# Patient Record
Sex: Male | Born: 1975 | Race: White | Hispanic: No | Marital: Single | Smoking: Former smoker
Health system: Southern US, Community
[De-identification: ages and names within clinical notes are randomized; demographics above are authoritative.]

## PROBLEM LIST (undated history)

## (undated) DIAGNOSIS — F17200 Nicotine dependence, unspecified, uncomplicated: Secondary | ICD-10-CM

## (undated) DIAGNOSIS — J45909 Unspecified asthma, uncomplicated: Secondary | ICD-10-CM

## (undated) DIAGNOSIS — F3189 Other bipolar disorder: Secondary | ICD-10-CM

## (undated) HISTORY — DX: Other bipolar disorder: F31.89

## (undated) HISTORY — DX: Nicotine dependence, unspecified, uncomplicated: F17.200

## (undated) HISTORY — DX: Unspecified asthma, uncomplicated: J45.909

---

## 2004-01-08 ENCOUNTER — Ambulatory Visit: Payer: Self-pay | Admitting: Internal Medicine

## 2004-02-24 ENCOUNTER — Ambulatory Visit: Payer: Self-pay | Admitting: Internal Medicine

## 2004-04-05 ENCOUNTER — Emergency Department (HOSPITAL_COMMUNITY): Admission: EM | Admit: 2004-04-05 | Discharge: 2004-04-05 | Payer: Self-pay | Admitting: Emergency Medicine

## 2004-05-27 ENCOUNTER — Ambulatory Visit: Payer: Self-pay | Admitting: Internal Medicine

## 2004-06-02 ENCOUNTER — Ambulatory Visit: Payer: Self-pay | Admitting: Internal Medicine

## 2004-06-24 ENCOUNTER — Ambulatory Visit: Payer: Self-pay | Admitting: Internal Medicine

## 2004-11-05 ENCOUNTER — Ambulatory Visit: Payer: Self-pay | Admitting: *Deleted

## 2004-11-19 ENCOUNTER — Ambulatory Visit: Payer: Self-pay | Admitting: Internal Medicine

## 2004-12-10 ENCOUNTER — Ambulatory Visit: Payer: Self-pay | Admitting: Internal Medicine

## 2004-12-17 ENCOUNTER — Ambulatory Visit: Payer: Self-pay | Admitting: Licensed Clinical Social Worker

## 2005-01-15 ENCOUNTER — Ambulatory Visit: Payer: Self-pay | Admitting: Family Medicine

## 2005-03-10 ENCOUNTER — Ambulatory Visit: Payer: Self-pay | Admitting: Internal Medicine

## 2005-04-06 ENCOUNTER — Ambulatory Visit: Payer: Self-pay | Admitting: Internal Medicine

## 2006-01-24 ENCOUNTER — Ambulatory Visit: Payer: Self-pay | Admitting: Internal Medicine

## 2006-09-01 DIAGNOSIS — F3189 Other bipolar disorder: Secondary | ICD-10-CM | POA: Insufficient documentation

## 2006-10-26 ENCOUNTER — Ambulatory Visit: Payer: Self-pay | Admitting: Internal Medicine

## 2006-10-26 ENCOUNTER — Encounter: Payer: Self-pay | Admitting: Internal Medicine

## 2006-10-26 DIAGNOSIS — J45909 Unspecified asthma, uncomplicated: Secondary | ICD-10-CM | POA: Insufficient documentation

## 2007-07-21 ENCOUNTER — Ambulatory Visit: Payer: Self-pay | Admitting: Psychiatry

## 2007-07-21 ENCOUNTER — Inpatient Hospital Stay (HOSPITAL_COMMUNITY): Admission: AD | Admit: 2007-07-21 | Discharge: 2007-07-23 | Payer: Self-pay | Admitting: Psychiatry

## 2007-10-25 ENCOUNTER — Emergency Department (HOSPITAL_COMMUNITY): Admission: EM | Admit: 2007-10-25 | Discharge: 2007-10-25 | Payer: Self-pay | Admitting: *Deleted

## 2007-10-28 ENCOUNTER — Emergency Department (HOSPITAL_COMMUNITY): Admission: EM | Admit: 2007-10-28 | Discharge: 2007-10-29 | Payer: Self-pay | Admitting: Emergency Medicine

## 2008-07-24 ENCOUNTER — Ambulatory Visit: Payer: Self-pay | Admitting: Internal Medicine

## 2008-07-25 ENCOUNTER — Telehealth: Payer: Self-pay | Admitting: Internal Medicine

## 2008-07-25 DIAGNOSIS — F17200 Nicotine dependence, unspecified, uncomplicated: Secondary | ICD-10-CM | POA: Insufficient documentation

## 2008-08-15 ENCOUNTER — Telehealth: Payer: Self-pay | Admitting: Internal Medicine

## 2008-08-21 ENCOUNTER — Encounter: Payer: Self-pay | Admitting: Internal Medicine

## 2008-08-22 ENCOUNTER — Telehealth: Payer: Self-pay | Admitting: Internal Medicine

## 2008-08-29 ENCOUNTER — Encounter: Payer: Self-pay | Admitting: Internal Medicine

## 2008-09-01 ENCOUNTER — Telehealth (INDEPENDENT_AMBULATORY_CARE_PROVIDER_SITE_OTHER): Payer: Self-pay | Admitting: *Deleted

## 2008-09-12 ENCOUNTER — Telehealth: Payer: Self-pay | Admitting: Internal Medicine

## 2008-09-19 ENCOUNTER — Telehealth: Payer: Self-pay | Admitting: Internal Medicine

## 2008-10-07 ENCOUNTER — Telehealth: Payer: Self-pay | Admitting: Internal Medicine

## 2008-11-04 ENCOUNTER — Encounter: Payer: Self-pay | Admitting: Internal Medicine

## 2008-11-24 ENCOUNTER — Telehealth: Payer: Self-pay | Admitting: Internal Medicine

## 2008-11-25 ENCOUNTER — Telehealth: Payer: Self-pay | Admitting: Internal Medicine

## 2008-11-27 ENCOUNTER — Telehealth: Payer: Self-pay | Admitting: Internal Medicine

## 2008-12-17 ENCOUNTER — Telehealth: Payer: Self-pay | Admitting: Internal Medicine

## 2009-02-02 ENCOUNTER — Telehealth: Payer: Self-pay | Admitting: Internal Medicine

## 2009-02-04 ENCOUNTER — Telehealth: Payer: Self-pay | Admitting: Internal Medicine

## 2009-02-19 ENCOUNTER — Telehealth: Payer: Self-pay | Admitting: Internal Medicine

## 2009-03-02 ENCOUNTER — Encounter: Payer: Self-pay | Admitting: Internal Medicine

## 2009-03-04 ENCOUNTER — Telehealth: Payer: Self-pay | Admitting: Internal Medicine

## 2009-03-06 ENCOUNTER — Telehealth: Payer: Self-pay | Admitting: Internal Medicine

## 2009-05-07 ENCOUNTER — Telehealth: Payer: Self-pay | Admitting: Internal Medicine

## 2009-05-11 ENCOUNTER — Telehealth: Payer: Self-pay | Admitting: Internal Medicine

## 2009-05-20 ENCOUNTER — Telehealth: Payer: Self-pay | Admitting: Internal Medicine

## 2009-05-26 ENCOUNTER — Encounter: Payer: Self-pay | Admitting: Internal Medicine

## 2009-06-08 ENCOUNTER — Ambulatory Visit: Payer: Self-pay | Admitting: Internal Medicine

## 2009-06-16 ENCOUNTER — Telehealth: Payer: Self-pay | Admitting: Internal Medicine

## 2009-07-07 ENCOUNTER — Encounter: Payer: Self-pay | Admitting: Internal Medicine

## 2009-07-07 ENCOUNTER — Telehealth: Payer: Self-pay | Admitting: Internal Medicine

## 2009-09-15 ENCOUNTER — Telehealth: Payer: Self-pay | Admitting: Internal Medicine

## 2009-09-15 ENCOUNTER — Encounter: Payer: Self-pay | Admitting: Internal Medicine

## 2009-09-22 ENCOUNTER — Encounter: Payer: Self-pay | Admitting: Internal Medicine

## 2009-10-01 ENCOUNTER — Encounter: Payer: Self-pay | Admitting: Internal Medicine

## 2009-10-06 ENCOUNTER — Telehealth: Payer: Self-pay | Admitting: Internal Medicine

## 2010-02-27 ENCOUNTER — Encounter: Payer: Self-pay | Admitting: Internal Medicine

## 2010-03-09 NOTE — Medication Information (Signed)
Summary: Assist forms/[employer]  Assist forms/[employer]   Imported By: Sherian Rein 09/22/2009 08:35:26  _____________________________________________________________________  External Attachment:    Type:   Image     Comment:   External Document

## 2010-03-09 NOTE — Letter (Signed)
Summary: Generic Letter  [facility]  [address] [place], [place] [address]   Phone: [phone]  Fax: [phone]      03/02/2009  Mark Page [address] [place], [place]  [address]   Dear Mr. Brooke Dare,  Our office has not received a return call from you regarding your patient assistance paperwork. Enclosed are patient assistance forms and a 4506-T tax form that you must sign and mail/fax in order to receive your medications. Please call our office if you have any questions.      Sincerely,   Lucious Groves, CMA (AAMA)

## 2010-03-09 NOTE — Assessment & Plan Note (Signed)
Summary: PER MOM ANGER CRYING SELF HURTING THREATS--STC   Vital Signs:  Patient profile:   35 year old male Height:      69 inches Weight:      162 pounds BMI:     24.01 O2 Sat:      97 % on Room air Temp:     98.1 degrees F oral Pulse rate:   78 / minute BP sitting:   132 / 86  (left arm) Cuff size:   regular  Vitals Entered By: Bill Salinas CMA (Jun 08, 2009 1:06 PM)  O2 Flow:  Room air CC: pt had episode of anger and crying with self hurting threats/ ab   Primary Care Provider:  [facility]  CC:  pt had episode of anger and crying with self hurting threats/ ab.  History of Present Illness: Patient with severe bipolar disease who presents, along with his mother, for assistance with life stressors and meds. 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 He was out of depakote for 3 weeks. He was having night terrors which he blammed on seroquel, so he stopped this med. He has had several manic episodes including uncontrolled anger episodes. He reports having a 5 day in-patient stay at [facility] and was diagnosed with depression, not bipolar disease, and was started on an antidepressant. He was seen at Mental Health but felt the in-take physician did not believe he was bipolar. He was directed to a half-way house, [facility], but feels "he is not that kind of person."   He is desperate and very unhappy. He on direct questioning denies any suicidal ideation claming he would never hurt his mother that way.  Current Medications (verified): 1)  Lamictal 100 Mg  Tabs (Lamotrigine) .... Take 1 Tablet By Mouth Once A Day 2)  Depakote 500 Mg Tbec (Divalproex Sodium) .Marland Kitchen.. 1 Tab Three Times A Day 3)  Seroquel 100 Mg  Tabs (Quetiapine Fumarate) .... 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  The patient denies fever, vision loss, decreased hearing, chest pain, syncope, dyspnea on exertion, peripheral edema, abdominal pain, hematochezia, muscle weakness, suspicious skin lesions, difficulty walking, and enlarged lymph nodes.    Physical Exam  General:  Mildly dissheveled white male who has rapid speech and what seems to be poor focus, having to be brought back on track during our interview.  Head:  normocephalic and atraumatic.   Neck:  supple.   Chest Wall:  no deformities.   Lungs:  normal respiratory effort, normal breath sounds, no fremitus, and no crackles.   Heart:  normal rate, regular rhythm, no murmur, no rub, and no JVD.   Abdomen:  soft and normal bowel sounds.   Msk:  no joint tenderness, no joint swelling, and no joint instability.   Pulses:  2+ radial Neurologic:  alert & oriented X3, cranial nerves II-XII intact, strength normal in all extremities, and gait normal.   Skin:  turgor normal.   Psych:  Oriented X3.  Poor eye contact. anxious demeanor. Rapid speech. No frankly paranoid thougts, no suicidal ideation.    Impression & Recommendations:  Problem # 1:  Hx of DISORDER, BIPOLAR NEC (ICD-296.89) Patient now getting back on meds except he doesn't have serquel. Looked at prices for other 2nd generation antipsychotics - no price advantage. He is now homeless and at loose ends not knowing which way to turn. He is not suicidal by his report     Plan - lexapro 10mg  once daily - 28 days samples provided.           Continue present meds; reapply for patient assistance for seroquel with proof of income          Referred to [facility] with hopes of getting psychiatric and counselling assistance.          Referred to [facility], mental health pharmacy. He should also work with the [place] system program of fee reductions for the poor.           Suggested DSS for emergency services.           Told him to keep an open mind about [facility].   His condition is worrisome but does not seem life-threatening. He is to call back if he has any ideas of self harm or harm to others.   Complete Medication List: 1)  Lamictal 100 Mg Tabs (Lamotrigine) .... Take 1 tablet by mouth once a day 2)  Depakote 500 Mg Tbec (Divalproex sodium) .Marland Kitchen.. 1 tab three times a day 3)  Seroquel 100 Mg Tabs (Quetiapine fumarate) .... Qhs

## 2010-03-09 NOTE — Medication Information (Signed)
Summary: Assist forms/[employer]  Assist forms/[employer]   Imported By: Sherian Rein 09/22/2009 08:36:58  _____________________________________________________________________  External Attachment:    Type:   Image     Comment:   External Document

## 2010-03-09 NOTE — Progress Notes (Signed)
  Phone Note Other Incoming   Summary of Call: received package for pt assistance with 3 month supply of Depakote 500mg . lmoam for pt to call back to inform him to pick up meds Initial call taken by: Ami Bullins CMA,  October 06, 2009 3:11 PM  Follow-up for Phone Call        mailed pt at letter stating that his assistance medication for depakote is here at the office and he can come pick it up at anytime Follow-up by: Ami Bullins CMA,  October 07, 2009 11:03 AM     Appended Document:  Pt informed

## 2010-03-09 NOTE — Progress Notes (Signed)
Summary: LEXAPRO  Phone Note Call from Patient Call back at [phone]   Summary of Call: Pt was given lexapro at last office visit. Ok to update EMR? Pt will drop off patient assistance paperwork today.  Initial call taken by: Lamar Sprinkles, CMA,  Jul 07, 2009 1:46 PM  Follow-up for Phone Call        Please - update med list.  Follow-up by: Jacques Navy MD,  Jul 07, 2009 6:04 PM  Additional Follow-up for Phone Call Additional follow up Details #1::        Samples x 6 wks ready, left vm for pt Additional Follow-up by: Lamar Sprinkles, CMA,  July 08, 2009 8:29 AM    New/Updated Medications: LEXAPRO 10 MG TABS (ESCITALOPRAM OXALATE) 1 once daily

## 2010-03-09 NOTE — Progress Notes (Signed)
Summary: PT ASSIST REFILL  Phone Note Call from Patient Call back at [phone]   Summary of Call: Patient is requesting refill of Depakote thru patient assistance. Need to call pt assist for refill, forms should be scanned into pt's chart.  Initial call taken by: Lamar Sprinkles, CMA,  May 11, 2009 3:09 PM  Follow-up for Phone Call        pt states he has spoken with pt assistance and they have informed him that this medication is on back order, pt is afraid what will happen if he is not taken this medicaiton, what do you advise? Follow-up by: Ami Bullins CMA,  May 13, 2009 1:05 PM  Additional Follow-up for Phone Call Additional follow up Details #1::        I have no other helpful alternative, such as different medication  at this time Additional Follow-up by: Corwin Levins MD,  May 13, 2009 1:11 PM     Appended Document: PT ASSIST REFILL On friday I called for refill and have not been notified of any back order on medication.

## 2010-03-09 NOTE — Medication Information (Signed)
Summary: [employer] Patient Assistance  [employer] Patient Assistance   Imported By: Lester Tenkiller 09/24/2009 09:30:59  _____________________________________________________________________  External Attachment:    Type:   Image     Comment:   External Document

## 2010-03-09 NOTE — Progress Notes (Signed)
  Phone Note Call from Patient Call back at [phone]   Summary of Call: Patient needs pt assistance forms completed and [place] to call in refill of depakote thru patient assistance.  Initial call taken by: Lamar Sprinkles, CMA,  September 15, 2009 4:40 PM  Follow-up for Phone Call        Depakote assistance has expired, new form is on the way to office to be completed.  Follow-up by: Lamar Sprinkles, CMA,  September 16, 2009 5:11 PM  Additional Follow-up for Phone Call Additional follow up Details #1::        Patient called to check stauts of pt asst forms fro Depakote. Lamictal, and Seroquel. Pt is out of Lexapro. Please advise.Alvy Beal Archie CMA  September 21, 2009 4:24 PM     Additional Follow-up for Phone Call Additional follow up Details #2::    Left vm for pt that forms are all completed.  Follow-up by: Lamar Sprinkles, CMA,  September 24, 2009 9:44 AM  Prescriptions: DEPAKOTE 500 MG TBEC (DIVALPROEX SODIUM) 1 tab three times a day  #270 x 3   Entered by:   Lamar Sprinkles, CMA   Authorized by:   Jacques Navy MD   Signed by:   Lamar Sprinkles, CMA on 09/22/2009   Method used:   Print then Give to Patient   RxID:   [id number] LEXAPRO 10 MG TABS (ESCITALOPRAM OXALATE) 1 once daily  #90 x 3   Entered by:   Lamar Sprinkles, CMA   Authorized by:   Jacques Navy MD   Signed by:   Lamar Sprinkles, CMA on 09/15/2009   Method used:   Print then Give to Patient   RxID:   [id number] SEROQUEL 100 MG  TABS (QUETIAPINE FUMARATE) qHS  #90 x 3   Entered by:   Lamar Sprinkles, CMA   Authorized by:   Jacques Navy MD   Signed by:   Lamar Sprinkles, CMA on 09/15/2009   Method used:   Print then Give to Patient   RxID:   [id number]

## 2010-03-09 NOTE — Progress Notes (Signed)
Summary: Depakote refill  Phone Note Refill Request Call back at [phone] Message from:  Patient on May 07, 2009 3:48 PM  Refills Requested: Medication #1:  DEPAKOTE ER 500 MG  TB24 three times a day Patient called stating that he is beginning to run low.  Initial call taken by: Lucious Groves,  May 07, 2009 3:48 PM  Follow-up for Phone Call        ok for as needed refill Follow-up by: Jacques Navy MD,  May 07, 2009 3:58 PM    Prescriptions: DEPAKOTE ER 500 MG  TB24 (DIVALPROEX SODIUM) three times a day  #36 x 0   Entered by:   Bill Salinas CMA   Authorized by:   Jacques Navy MD   Signed by:   Bill Salinas CMA on 05/08/2009   Method used:   Electronically to        [facility]  [address] [phone]* (retail)       [address]       [place], [place]  [address]       Ph: [phone] or [phone]       Fax: [phone]   RxID:   [id number]

## 2010-03-09 NOTE — Progress Notes (Signed)
Summary: Samples  Phone Note Call from Patient   Summary of Call: Patient is requesting samples of seroquel. In the past, see a previous phone note, Dr has approved samples of XR form.  Initial call taken by: Lamar Sprinkles, CMA,  March 06, 2009 11:39 AM  Follow-up for Phone Call        is this ok to give? Patient assistance paperwork was mailed to him and we have not rec'd a reply from company. Follow-up by: Lucious Groves,  March 10, 2009 10:31 AM  Additional Follow-up for Phone Call Additional follow up Details #1::        he may have samples if we have them Additional Follow-up by: Jacques Navy MD,  March 10, 2009 1:30 PM    Additional Follow-up for Phone Call Additional follow up Details #2::    clinic does not have ay samples of Seroquel at this time but I informed pt that we willkeep an eye out for the for him and we have been in contact with Pt Asst as well.  Follow-up by: Margaret Pyle, CMA,  March 10, 2009 2:03 PM

## 2010-03-09 NOTE — Medication Information (Signed)
Summary: Depakote approved/[employer] Patient Assist  Depakote approved/[employer] Patient Assist   Imported By: Sherian Rein 10/29/2009 09:54:08  _____________________________________________________________________  External Attachment:    Type:   Image     Comment:   External Document

## 2010-03-09 NOTE — Progress Notes (Signed)
Summary: Pt asst meds arrived  Phone Note Outgoing Call   Summary of Call: Left message on voicemail that patient assistance meds have arrived. Depakote ER 250mg . Initial call taken by: Lucious Groves,  Jun 16, 2009 3:57 PM

## 2010-03-09 NOTE — Progress Notes (Signed)
Summary: patient assitance  Phone Note Call from Patient Call back at Home Phone [phone] Call back at [phone]   Summary of Call: Patient left message on triage and I returned his call. I notified patient that after leaving messages with no reply, his paperwork was mailed to him. Patient is aware that the enclosed tax form with the patient assistance packet must be signed and sent in with the packet. I advised that the patient speak with them about why it is much easier to get his other meds, and inquire if the will accept the packet via fax. Initial call taken by: Lucious Groves,  March 04, 2009 12:52 PM  Follow-up for Phone Call        Is this OK Follow-up by: Jacques Navy MD,  March 04, 2009 1:42 PM  Additional Follow-up for Phone Call Additional follow up Details #1::        Yes. The patient must provide further income verification to [employer] in order to qualify for patient assistance.  There were two previous attempts to help the patient with this process. The form is an income request form that the patient must sign releasing his info from the IRS to [facility] & Me program. There is nothing further we can do at this point.  Additional Follow-up by: Lamar Sprinkles, CMA,  March 05, 2009 8:56 AM

## 2010-03-09 NOTE — Progress Notes (Signed)
Summary: [employer] Pt Assistance  Phone Note Call from Patient Call back at Home Phone [phone]   Summary of Call: Patient is requesting a call regarding seroquel pt assistance. Per pt, when he called to get a refill he was told that his assistance was terminated. Need to call drug comp and inquire as how to help the patient.  Initial call taken by: Lamar Sprinkles, CMA,  February 19, 2009 6:25 PM  Follow-up for Phone Call        Spoke with Henre[employer] and patient meds went out at the end of December, he is still eligble in the program and will refill in feb 2011. I also called into the AZ&Me program, and the app was incomplete. There were some areas not filled out, and the letter from the MD that patient does not have any income is insufficient. They will fax me a form to be filled out and prove the patient did not file taxes. If taxes were filed, a copy must be included with the app. Follow-up by: Lucious Groves,  February 20, 2009 9:07 AM  Additional Follow-up for Phone Call Additional follow up Details #1::        Called patient to discuss, Left message on voicemail to call back to office.Lucious Groves,  February 20, 2009 9:24 AM  Please see phone note from last october. Tax form was completed and mailed. SORRY!!! I must not have scanned a copy in EMR. They must not have gotten form? Just FYI for you........................Marland KitchenLamar Sprinkles, CMA  February 24, 2009 10:37 AM     Additional Follow-up for Phone Call Additional follow up Details #2::    Either way the patient has to sign the tax exemption form.  Left message on voicemail to call back to office. Lucious Groves,  February 25, 2009 4:07 PM  Left message on voicemail to call back to office.Lucious Groves,  February 26, 2009 1:00 PM  Made one last attempt to contact the patient-Left message on voicemail notifying patient that paperwork requires his signature. This is last attempt, if no response from patient I will mail a letter to arrange for  signature.Lucious Groves,  February 27, 2009 11:12 AM  Additional Follow-up for Phone Call Additional follow up Details #3:: Details for Additional Follow-up Action Taken: Patient has not returned my calll to come in and sign forms--Specifically 4506-T tax form. Mailed paperwork to patient home address with letter. Additional Follow-up by: Lucious Groves,  March 02, 2009 10:41 AM

## 2010-03-09 NOTE — Medication Information (Signed)
Summary: [facility] [employer] Patient Assist Program  [facility] Patient Assist Program   Imported By: Sherian Rein 07/09/2009 10:55:45  _____________________________________________________________________  External Attachment:    Type:   Image     Comment:   External Document

## 2010-03-09 NOTE — Progress Notes (Signed)
Summary: Depakote  Phone Note Call from Patient Call back at [phone]   Summary of Call: Patient called b/c he is completely out of Depakote. Patient would like to know if we can do patient assistance for him to take the 250mg , which he has spoken with patient assistance and verified that it is available. (He takes the 500mg  three times a day). Please advise. Initial call taken by: Lucious Groves,  May 20, 2009 3:01 PM  Follow-up for Phone Call        Maralyn Sago has helped with patient assistance in the past. He may take the depakote 250 2 tabs three times a day = 180/month as needed refills Follow-up by: Jacques Navy MD,  May 20, 2009 3:05 PM  Additional Follow-up for Phone Call Additional follow up Details #1::        New form completed & faxed Additional Follow-up by: Lamar Sprinkles, CMA,  May 26, 2009 4:44 PM    New/Updated Medications: DEPAKOTE ER 250 MG XR24H-TAB (DIVALPROEX SODIUM) 2 three times a day

## 2010-03-09 NOTE — Medication Information (Signed)
Summary: Seroquel/Free [employer]  Seroquel/Free [employer]   Imported By: Lester Olmito and Olmito 03/04/2009 14:41:55  _____________________________________________________________________  External Attachment:    Type:   Image     Comment:   External Document

## 2010-03-09 NOTE — Medication Information (Signed)
Summary: Depakote/[employer] Pt Assist Program  Depakote/[employer] Pt Assist Program   Imported By: Sherian Rein 06/01/2009 08:32:17  _____________________________________________________________________  External Attachment:    Type:   Image     Comment:   External Document

## 2010-03-12 NOTE — Medication Information (Signed)
Summary: [employer] Patient Assist Program  [employer] Patient Assist Program   Imported By: Sherian Rein 07/09/2009 10:54:10  _____________________________________________________________________  External Attachment:    Type:   Image     Comment:   External Document

## 2010-04-20 ENCOUNTER — Inpatient Hospital Stay (INDEPENDENT_AMBULATORY_CARE_PROVIDER_SITE_OTHER)
Admission: RE | Admit: 2010-04-20 | Discharge: 2010-04-20 | Disposition: A | Discharge status: Home / Self Care | Payer: Self-pay | Source: Ambulatory Visit | Attending: Family Medicine | Admitting: Family Medicine

## 2010-04-20 ENCOUNTER — Ambulatory Visit (INDEPENDENT_AMBULATORY_CARE_PROVIDER_SITE_OTHER): Payer: Self-pay

## 2010-04-20 DIAGNOSIS — S20219A Contusion of unspecified front wall of thorax, initial encounter: Secondary | ICD-10-CM

## 2010-06-22 NOTE — H&P (Signed)
NAME:  Mark Page, TRAMMEL NO.:  [id number]   MEDICAL RECORD NO.:  [id number]          PATIENT TYPE:  IPS   LOCATION:  0301                          FACILITY:  [facility]   PHYSICIAN:  Geoffery Lyons, M.D.      DATE OF BIRTH:  12-02-1975   DATE OF ADMISSION:  07/21/2007  DATE OF DISCHARGE:                       PSYCHIATRIC ADMISSION ASSESSMENT   IDENTIFYING INFORMATION:  This is a 35 year old single white male. Mr.  Mark Page presented as a walk-in last evening. He reported that he has had a  past diagnosis for bipolar disorder. He is having problems with his  primary support system. He indicated that he was evicted from his former  place on living on July 17, 2007. Since that time, he has been  experiencing racing thoughts, insomnia, and panic attacks. He reported  he was tired of living in constant torment of always hurting people who  love him. He reports that he has a history for abusing alcohol, Xanax  and marijuana. He states he has not used Xanax in years. He probably  will be positive on his UDS for marijuana, and when he does drink, he  binges, but he has not been under the influence of alcohol in a while.   He states that approximately three years ago he sought psychiatric  assistance from [facility]. He states he was given a diagnosis of  bipolar disorder then. He was treated with Lamictal, Seroquel and  Depakote. He stated that he had side effects, that it made him shake,  and it did not help his racing thoughts. Apparently, when he becomes  worried, he experiences racing thoughts. He also reports that his panic  comes from stress. He has not been on medications in quite some time. He  has no prior hospitalizations.   SOCIAL HISTORY:  He reports having gotten a GED and come college. He has  been unemployed for the past year and a half. 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 He was staying with friends  in a house the past 9 months.  They argued, and they told him to leave. He had prepared his June's  rent. He asked the police to come out to get him reimbursement. The  people in the house said that they would give the money to his mother.  However, none of the above has happened.   FAMILY HISTORY:  He denies any family history for mental illness.   ALCOHOL AND DRUG HISTORY:  Has already been alluded to.   PRIMARY CARE PHYSICIAN:  Dr. Illene Regulus at [facility]. He has not  seen Dr. Nolen Mu for medication management in several years.   MEDICAL PROBLEMS:  He denies any.   MEDICATIONS:  He is not prescribed any.   DRUG ALLERGIES:  He is not known to have any drug allergies.   POSITIVE PHYSICAL FINDINGS:  This is a well-developed, well-nourished  male who appears his stated age of 43.  VITAL SIGNS:  On admission show he is 132 cm. He weighs  158. His  temperature is 97.2. Blood pressure was 125/82 to 135/83. Pulse was 73.  Respirations are 16.  Apparently, he has been known to have asthma in the past, and indeed  today, he sounds kind of junky. He reports he has been smoking over 2  packs of cigarettes a day for the past 4 days, and when examined, he did  have some upper respiratory wheezes.   He has no other positives on his review of systems, and unfortunately,  his lab is pending.   MENTAL STATUS EXAMINATION:  Today, he is alert and oriented. He was  casually dressed in hospital attire. His clothing is being washed. He  appeared to be adequately groomed and nourished. His motor was within  normal limits, and he had fair eye contact. His speech is not pressured.  His mood is depressed. His affect is decreased. Thought processes were  clear, rationale, and goal oriented although somewhat fatalistic. He  does not see much help for him at the moment. Judgment and insight are  fair. Concentration and memory are fair. Intelligence is at least  average. He specifically denies  being suicidal or homicidal. He denies  auditory or visual hallucinations.   DIAGNOSES:  AXIS I:  Adjustment disorder with depressed mood. History  for impulse control disorder.  AXIS II:  Deferred.  AXIS III:  Asthma.  AXIS IV:  Severe problems with primary support group, problems related  to the social environment, occupational, housing, and economic issues.  AXIS V:  30.   PLAN:  Admit for safety and stabilization. We will start him on some  medication, being sensitive to his financial situation. Will start him  on some Celexa a.m. and h.s. and also order some Neurontin p.r.n. for  his panic attacks. He was given the 1-800 number to the small business  association to help him sort out his current employment issues and his  responsibilities vis-a-vis his company. Estimated length of stay is 3 to  4 days. Will have the case manager work with him on Monday regarding  discharge placement as he currently is residing in his [place] and denies  any social support from his family.      Mickie Leonarda Salon, P.A.-C.      Geoffery Lyons, M.D.  Electronically Signed    MD/MEDQ  D:  07/21/2007  T:  07/21/2007  Job:  [id number]

## 2010-06-25 NOTE — Assessment & Plan Note (Signed)
[facility]                                 ON-CALL NOTE   NAME:Russaw, [place] Elbert Ewings                         MRN:          [id number]  DATE:01/27/2006                            DOB:          1975/12/31    Phone number [phone].   SUBJECTIVE:  Mr. Baig states he was given a voucher for Lamictal.  When  he went to the pharmacy they said that he needed a prescription as well.  He is not sure if Dr. Debby Bud' nurse sent the prescription to any  pharmacy.   ASSESSMENT AND PLAN:  The patient was instructed to call the office in  the morning.  He has not been on Lamictal for some time, and will not go  through withdrawal.     Kerby Nora, MD  Electronically Signed    AB/MedQ  DD: 01/27/2006  DT: 01/29/2006  Job #: [id number]
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
## 2010-11-08 LAB — DIFFERENTIAL
Basophils Absolute: 0
Basophils Relative: 1
Eosinophils Absolute: 0.2
Eosinophils Relative: 2
Lymphocytes Relative: 30
Lymphs Abs: 2.4
Monocytes Absolute: 0.4
Monocytes Relative: 6
Neutro Abs: 5
Neutrophils Relative %: 62

## 2010-11-08 LAB — CBC
HCT: 39.3
Hemoglobin: 13.3
MCHC: 33.8
MCV: 92.8
Platelets: 231
RBC: 4.23
RDW: 12.9
WBC: 8.1

## 2010-11-08 LAB — POCT I-STAT, CHEM 8
Glucose, Bld: 106 — ABNORMAL HIGH
HCT: 40
Hemoglobin: 13.6
Potassium: 3.7
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
## 2012-01-20 ENCOUNTER — Emergency Department (HOSPITAL_COMMUNITY)
Admission: EM | Admit: 2012-01-20 | Discharge: 2012-01-20 | Disposition: A | Discharge status: Home / Self Care | Payer: Self-pay | Attending: Emergency Medicine | Admitting: Emergency Medicine

## 2012-01-20 ENCOUNTER — Encounter (HOSPITAL_COMMUNITY): Payer: Self-pay | Admitting: Adult Health

## 2012-01-20 DIAGNOSIS — R21 Rash and other nonspecific skin eruption: Secondary | ICD-10-CM | POA: Insufficient documentation

## 2012-01-20 DIAGNOSIS — G501 Atypical facial pain: Secondary | ICD-10-CM | POA: Insufficient documentation

## 2012-01-20 DIAGNOSIS — Z87891 Personal history of nicotine dependence: Secondary | ICD-10-CM | POA: Insufficient documentation

## 2012-01-20 MED ORDER — IBUPROFEN 800 MG PO TABS
800.0000 mg | ORAL_TABLET | Freq: Once | ORAL | Status: AC
  Administered 2012-01-20: 800 mg via ORAL
  Filled 2012-01-20: qty 1

## 2012-01-20 MED ORDER — CLINDAMYCIN HCL 300 MG PO CAPS
300.0000 mg | ORAL_CAPSULE | Freq: Once | ORAL | Status: AC
  Administered 2012-01-20: 300 mg via ORAL
  Filled 2012-01-20 (×2): qty 1

## 2012-01-20 MED ORDER — CLINDAMYCIN HCL 150 MG PO CAPS: 150.0000 mg | ORAL_CAPSULE | Freq: Four times a day (QID) | ORAL | Status: DC

## 2012-01-20 MED ORDER — HYDROCODONE-ACETAMINOPHEN 5-325 MG PO TABS: 2.0000 | ORAL_TABLET | ORAL | Status: DC | PRN

## 2012-01-20 NOTE — ED Provider Notes (Signed)
Medical screening examination/treatment/procedure(s) were performed by non-physician practitioner and as supervising physician I was immediately available for consultation/collaboration.  Olivia Mackie, MD 01/20/12 [phone]

## 2012-01-20 NOTE — ED Notes (Signed)
C/o left sided "ingrown hair that I tried to dig out this evening and it started swelling and it has worsened over the last 6 hours" c/o pain rated 9/10 and is constant, left sided facial redness noted and induration. Airway intact.  Reports small amount of drainage.

## 2012-01-20 NOTE — ED Notes (Signed)
Patient called requesting more pain medication. Patient can follow up with PCP or come back if necessary per Abigail.

## 2012-01-20 NOTE — ED Notes (Signed)
Pharmacy called for clindamycin

## 2012-01-20 NOTE — ED Notes (Signed)
PA at bedside.

## 2012-01-20 NOTE — ED Provider Notes (Signed)
History     CSN: [id number]  Arrival date & time 01/20/12  0210   First MD Initiated Contact with Patient 01/20/12 0301      Chief Complaint  Patient presents with  . Facial Pain    (Consider location/radiation/quality/duration/timing/severity/associated sxs/prior treatment) HPI  36 year old male presents with facial pain. Patient reports he had an ingrown hair on his L upper lip in which he attempted to pop it last night.  He was unable to pop it so pt tried "digging it out" with a knife.  He went to sleep and within the past 6 hrs he notice increased swelling to his upper lip with throbbing pain to affected area.  Onset gradual, persistent, moderate in severity, non radiating, worsening with applying pressure.  Denies fever, headache, throat or tongue swelling, trouble swallowing, shortness of breath, or rash.  No prior history of abscess.  No treatment tried.  No new medications  History reviewed. No pertinent past medical history.  History reviewed. No pertinent past surgical history.  History reviewed. No pertinent family history.  History  Substance Use Topics  . Smoking status: Former Games developer  . Smokeless tobacco: Not on file  . Alcohol Use: No      Review of Systems  Constitutional: Negative for fever.  Skin: Positive for wound. Negative for rash.  Neurological: Negative for numbness.    Allergies  Review of patient's allergies indicates no known allergies.  Home Medications  No current outpatient prescriptions on file.  BP 129/80  Pulse 80  Temp 98.5 F (36.9 C) (Oral)  Resp 14  SpO2 97%  Physical Exam  Nursing note and vitals reviewed. Constitutional: He is oriented to person, place, and time. He appears well-developed and well-nourished. No distress.  HENT:  Head: Normocephalic and atraumatic.  Mouth/Throat: Oropharynx is clear and moist. No oropharyngeal exudate.  Eyes: Conjunctivae normal and EOM are normal. Pupils are equal, round, and reactive  to light.  Neck: Neck supple.  Neurological: He is alert and oriented to person, place, and time.  Skin:       L upper lip is moderately indurated and edematous.  Small scab noted to other lip with a small amount of drainage.  No obvious fluctuance.  Ttp.  No erythema.       ED Course  Procedures (including critical care time)  Labs Reviewed - No data to display No results found.   No diagnosis found.  1. Lip edema  MDM  Pt developed edema to L upper lip after attempting to extract and ingrown hair.  Incident happened < 12 hrs with moderate swelling.  Doubt abscess.  Will recommend warm compress, will give abx and pain meds.  Return precaution discussed.  Pt voice understanding and agrees with plan.  No airway compromise.    BP 129/80  Pulse 80  Temp 98.5 F (36.9 C) (Oral)  Resp 14  SpO2 97%       Fayrene Helper, PA-C 01/20/12 0319

## 2012-02-19 ENCOUNTER — Encounter (HOSPITAL_COMMUNITY): Payer: Self-pay

## 2012-02-19 ENCOUNTER — Emergency Department (HOSPITAL_COMMUNITY)
Admission: EM | Admit: 2012-02-19 | Discharge: 2012-02-19 | Disposition: A | Discharge status: Home / Self Care | Payer: Self-pay | Attending: Emergency Medicine | Admitting: Emergency Medicine

## 2012-02-19 DIAGNOSIS — L03211 Cellulitis of face: Secondary | ICD-10-CM | POA: Insufficient documentation

## 2012-02-19 DIAGNOSIS — L0201 Cutaneous abscess of face: Secondary | ICD-10-CM | POA: Insufficient documentation

## 2012-02-19 DIAGNOSIS — Z79899 Other long term (current) drug therapy: Secondary | ICD-10-CM | POA: Insufficient documentation

## 2012-02-19 DIAGNOSIS — L0291 Cutaneous abscess, unspecified: Secondary | ICD-10-CM

## 2012-02-19 DIAGNOSIS — Z87891 Personal history of nicotine dependence: Secondary | ICD-10-CM | POA: Insufficient documentation

## 2012-02-19 MED ORDER — SULFAMETHOXAZOLE-TRIMETHOPRIM 800-160 MG PO TABS: 1.0000 | ORAL_TABLET | Freq: Two times a day (BID) | ORAL | Status: DC

## 2012-02-19 NOTE — ED Provider Notes (Signed)
Medical screening examination/treatment/procedure(s) were conducted as a shared visit with non-physician practitioner(s) and myself.  I personally evaluated the patient during the encounter  Please see my separate respective documentation pertaining to this patient encounter   Burgundy Matuszak D Kemiya Batdorf, MD 02/19/12 0712 

## 2012-02-19 NOTE — ED Provider Notes (Addendum)
History     CSN: [id number]  Arrival date & time 02/19/12  0436   First MD Initiated Contact with Patient 02/19/12 0559      Chief Complaint  Patient presents with  . Abscess    (Consider location/radiation/quality/duration/timing/severity/associated sxs/prior treatment) HPI Comments: This is a 37 year old male, who presents emergency department with chief complaint of abscess. Patient states that he was recently shaving, and cut his chin. After several days he noticed an abscess developing. He is tried using warm compresses, and tried to express fluid from the abscess, gaining some relief after doing so. However, he states that his symptoms are worsening. He states that his level of discomfort is moderate. His symptoms do not radiate.  He denies fever, chills, nausea, and vomiting.  He has a history of the same.  The history is provided by the patient. No language interpreter was used.    History reviewed. No pertinent past medical history.  History reviewed. No pertinent past surgical history.  History reviewed. No pertinent family history.  History  Substance Use Topics  . Smoking status: Former Games developer  . Smokeless tobacco: Not on file  . Alcohol Use: No      Review of Systems  All other systems reviewed and are negative.    Allergies  Review of patient's allergies indicates no known allergies.  Home Medications   Current Outpatient Rx  Name  Route  Sig  Dispense  Refill  . CLINDAMYCIN HCL 150 MG PO CAPS   Oral   Take 1 capsule (150 mg total) by mouth every 6 (six) hours.   28 capsule   0   . HYDROCODONE-ACETAMINOPHEN 5-325 MG PO TABS   Oral   Take 2 tablets by mouth every 4 (four) hours as needed for pain.   10 tablet   0     BP 122/82  Pulse 78  Temp 97.4 F (36.3 C) (Oral)  Resp 20  SpO2 100%  Physical Exam  Nursing note and vitals reviewed. Constitutional: He is oriented to person, place, and time. He appears well-developed and  well-nourished.  HENT:  Head: Normocephalic and atraumatic.  Eyes: Conjunctivae normal and EOM are normal.  Neck: Normal range of motion.  Cardiovascular: Normal rate.   Pulmonary/Chest: Effort normal.  Abdominal: He exhibits no distension.  Musculoskeletal: Normal range of motion.  Neurological: He is alert and oriented to person, place, and time.  Skin: Skin is dry.       2-3 cm abscess on the left inferior aspect of the the chin, with associated redness and erythema, mild purulent drainage.  Psychiatric: He has a normal mood and affect. His behavior is normal. Judgment and thought content normal.    ED Course  Procedures (including critical care time)  Labs Reviewed - No data to display No results found.   1. Abscess       MDM  37 year old male with abscess. Will treat the patient conservatively at this time, with warm compresses and Bactrim. The patient is to return in 48 hours, if he does not have improvement of his symptoms. Patient understands and agrees with the plan. He is stable and ready for discharge. This patient was seen by and discussed with Dr. Hyacinth Meeker, who agrees with the plan.  6:59 AM Patient requesting pain medicine.  I offered the patient Naprosyn.  The patient became verbally abusive, and was no longer cooperating or willing to listen to the treatment plan.  Attempted to explain to the patient that he  needed to be civilized.  Patient said, "I don't care what you give me, give me a leaf that you have wet, like the indians, and I will be happy." I reiterated that I would be happy to give him a prescription for Naprosyn, and he continued to be verbally abusive.  I left the room.  Patient discharged.        Roxy Horseman, PA-C 02/19/12 0640  Roxy Horseman, PA-C 02/19/12 [phone]

## 2012-02-19 NOTE — ED Notes (Signed)
Pt refused to sign discharge due to not getting pain medications

## 2012-02-19 NOTE — ED Notes (Signed)
Pt reports an abscess to (L) side of his chin, pt reports he cut his chin while shaving. Recently seen here for the same but to a different area, applied a hot compress to this area and was able to get "junk" out of it.

## 2012-02-19 NOTE — ED Provider Notes (Signed)
Patient presents with gradual onset of left lower chin abscess. This is in the sub-mental region on the left, it is red and indurated and tender but has a draining area with purulent material coming out from it. He has no trismus, no torticollis, no tenderness under his tongue, normal speech in phonation and no difficulty swallowing.  Filed Vitals:   02/19/12 0440  BP: 122/82  Pulse: 78  Temp: 97.4 F (36.3 C)  Resp: 20   The patient,he is afebrile at this time I believe that conservative medical therapy would be better than incision and drainage of an already open draining abscess.  Bactrim  Medical screening examination/treatment/procedure(s) were conducted as a shared visit with non-physician practitioner(s) and myself.  I personally evaluated the patient during the encounter    Vida Roller, MD 02/19/12 [phone]

## 2012-11-11 ENCOUNTER — Encounter (HOSPITAL_COMMUNITY): Payer: Self-pay | Admitting: *Deleted

## 2012-11-11 ENCOUNTER — Emergency Department (HOSPITAL_COMMUNITY)
Admission: EM | Admit: 2012-11-11 | Discharge: 2012-11-11 | Disposition: A | Discharge status: Home / Self Care | Payer: Self-pay | Attending: Emergency Medicine | Admitting: Emergency Medicine

## 2012-11-11 DIAGNOSIS — Z792 Long term (current) use of antibiotics: Secondary | ICD-10-CM | POA: Insufficient documentation

## 2012-11-11 DIAGNOSIS — L089 Local infection of the skin and subcutaneous tissue, unspecified: Secondary | ICD-10-CM | POA: Insufficient documentation

## 2012-11-11 DIAGNOSIS — Z87891 Personal history of nicotine dependence: Secondary | ICD-10-CM | POA: Insufficient documentation

## 2012-11-11 MED ORDER — CEPHALEXIN 500 MG PO CAPS: 500.0000 mg | ORAL_CAPSULE | Freq: Two times a day (BID) | ORAL | Status: DC

## 2012-11-11 MED ORDER — IBUPROFEN 400 MG PO TABS
800.0000 mg | ORAL_TABLET | Freq: Once | ORAL | Status: AC
  Administered 2012-11-11: 800 mg via ORAL
  Filled 2012-11-11: qty 2

## 2012-11-11 NOTE — ED Provider Notes (Signed)
Medical screening examination/treatment/procedure(s) were performed by non-physician practitioner and as supervising physician I was immediately available for consultation/collaboration.   William Marico Buckle, MD 11/11/12 2338 

## 2012-11-11 NOTE — ED Notes (Signed)
Pt states he thinks he may have been bitten by a spider on his back.  Two reddened warm to touch spots noted to pts lower back

## 2012-11-11 NOTE — ED Provider Notes (Signed)
CSN: [id number]     Arrival date & time 11/11/12  1931 History  This chart was scribed for non-physician practitioner, Irish Elders, NP working with Dagmar Hait, MD by Greggory Stallion, ED scribe. This patient was seen in room TR11C/TR11C and the patient's care was started at 8:27 PM.   Chief Complaint  Patient presents with  . Insect Bite   The history is provided by the patient. No language interpreter was used.    HPI Comments: Mark Page is a 37 y.o. male who presents to the Emergency Department complaining of an insect bite that occurred 4-5 days ago. Pt states it started out itchy. He states he has two spots on his back that are red and warm to the touch. Pt states he thinks he was bitten by a spider on his left lower back but never actually saw a spider. He states he recently got an addition to his tattoo right before he noticed the two spots. He states there was some drainage from one of the spots. Pt denies fever and chills. He denies any recent illness.   History reviewed. No pertinent past medical history. History reviewed. No pertinent past surgical history. No family history on file. History  Substance Use Topics  . Smoking status: Former Smoker    Types: Cigarettes    Quit date: 02/12/2012  . Smokeless tobacco: Not on file  . Alcohol Use: Yes     Comment: socially    Review of Systems  Constitutional: Negative for fever and chills.  Skin: Positive for wound.  All other systems reviewed and are negative.    Allergies  Review of patient's allergies indicates no known allergies.  Home Medications   Current Outpatient Rx  Name  Route  Sig  Dispense  Refill  . cephALEXin (KEFLEX) 500 MG capsule   Oral   Take 1 capsule (500 mg total) by mouth 2 (two) times daily.   14 capsule   0    BP 124/77  Pulse 82  Temp(Src) 98 F (36.7 C) (Oral)  Resp 16  SpO2 99%  Physical Exam  Nursing note and vitals reviewed. Constitutional: He is oriented to person,  place, and time. He appears well-developed and well-nourished. No distress.  HENT:  Head: Normocephalic and atraumatic.  Eyes: EOM are normal.  Neck: Neck supple. No tracheal deviation present.  Cardiovascular: Normal rate, regular rhythm and normal heart sounds.   Pulmonary/Chest: Effort normal and breath sounds normal. No respiratory distress. He has no wheezes. He has no rales.  Musculoskeletal: Normal range of motion.  Neurological: He is alert and oriented to person, place, and time.  Skin: Skin is warm and dry.     Top area 2-3 cm erythematous and warm to touch with centralized scabbing. Bottom area 1-2 cm erythematous and warm to touch with centralized scabbing.   Psychiatric: He has a normal mood and affect. His behavior is normal.    ED Course  Procedures (including critical care time)  DIAGNOSTIC STUDIES: Oxygen Saturation is 99% on RA, normal by my interpretation.    COORDINATION OF CARE: 8:31 PM-Discussed treatment plan which includes oral antibiotic with pt at bedside and pt agreed to plan.   Labs Review Labs Reviewed - No data to display Imaging Review No results found.  MDM    1. Skin infection  Soft/tissue skin infection from spider bite vs. Tattoo needle from recent work done.   I personally performed the services described in this documentation, which was  scribed in my presence. The recorded information has been reviewed and is accurate.      Irish Elders, NP 11/11/12 2112

## 2012-11-11 NOTE — ED Notes (Signed)
Pt discharged.Vital signs stable.Pt says he is pain.Pain med given.Reassured pt that pain will get better with the medication.

## 2012-11-12 ENCOUNTER — Encounter (HOSPITAL_COMMUNITY): Payer: Self-pay

## 2012-11-12 ENCOUNTER — Emergency Department (HOSPITAL_COMMUNITY)
Admission: EM | Admit: 2012-11-12 | Discharge: 2012-11-13 | Disposition: A | Discharge status: Home / Self Care | Payer: Self-pay | Attending: Emergency Medicine | Admitting: Emergency Medicine

## 2012-11-12 DIAGNOSIS — Z87891 Personal history of nicotine dependence: Secondary | ICD-10-CM | POA: Insufficient documentation

## 2012-11-12 DIAGNOSIS — M7989 Other specified soft tissue disorders: Secondary | ICD-10-CM | POA: Insufficient documentation

## 2012-11-12 DIAGNOSIS — L723 Sebaceous cyst: Secondary | ICD-10-CM | POA: Insufficient documentation

## 2012-11-12 DIAGNOSIS — L720 Epidermal cyst: Secondary | ICD-10-CM

## 2012-11-12 DIAGNOSIS — Z881 Allergy status to other antibiotic agents status: Secondary | ICD-10-CM | POA: Insufficient documentation

## 2012-11-12 DIAGNOSIS — M79609 Pain in unspecified limb: Secondary | ICD-10-CM | POA: Insufficient documentation

## 2012-11-12 MED ORDER — HYDROCODONE-ACETAMINOPHEN 5-325 MG PO TABS
1.0000 | ORAL_TABLET | Freq: Once | ORAL | Status: AC
  Administered 2012-11-12: 1 via ORAL
  Filled 2012-11-12: qty 1

## 2012-11-12 MED ORDER — LIDOCAINE HCL 2 % IJ SOLN
10.0000 mL | Freq: Once | INTRAMUSCULAR | Status: AC
  Administered 2012-11-12: 200 mg
  Filled 2012-11-12: qty 20

## 2012-11-12 MED ORDER — HYDROCODONE-ACETAMINOPHEN 5-325 MG PO TABS: 1.0000 | ORAL_TABLET | ORAL | Status: DC | PRN

## 2012-11-12 MED ORDER — SULFAMETHOXAZOLE-TMP DS 800-160 MG PO TABS: 1.0000 | ORAL_TABLET | Freq: Two times a day (BID) | ORAL | Status: DC

## 2012-11-12 MED ORDER — SULFAMETHOXAZOLE-TMP DS 800-160 MG PO TABS
1.0000 | ORAL_TABLET | Freq: Once | ORAL | Status: AC
  Administered 2012-11-12: 1 via ORAL
  Filled 2012-11-12: qty 1

## 2012-11-12 NOTE — ED Provider Notes (Signed)
Medical screening examination/treatment/procedure(s) were performed by non-physician practitioner and as supervising physician I was immediately available for consultation/collaboration.    Vida Roller, MD 11/12/12 2352

## 2012-11-12 NOTE — ED Provider Notes (Signed)
CSN: [id number]     Arrival date & time 11/12/12  2008 History   None    This chart was scribed for non-physician practitioner, Cherrie Distance PA-C, working with Vida Roller, MD by Arlan Organ, ED Scribe. This patient was seen in room TR06C/TR06C and the patient's care was started at 10:05 PM.   Chief Complaint  Patient presents with  . Abscess   The history is provided by the patient. No language interpreter was used.   HPI Comments: Mark Page is a 37 y.o. male who presents to the Emergency Department complaining of an abscess on his lower back that appeared about 5 days ago. He states he is also experiencing associated swelling and pain in his right thumb. Pt states at first he tried to pop the abscess with no relief, but the pain has now gradually worsened. Pt states he recently got a new tattoo on his back 10 days ago.  Pt was seen last night here at [facility] for the same complaint.  He states he is currently having difficulty with normal activities due to the pain. Pt has been taking prescribed ABX with no relief. Pt denies any numbness or weakness.  History reviewed. No pertinent past medical history. History reviewed. No pertinent past surgical history. History reviewed. No pertinent family history. History  Substance Use Topics  . Smoking status: Former Smoker    Types: Cigarettes    Quit date: 02/12/2012  . Smokeless tobacco: Not on file  . Alcohol Use: Yes     Comment: socially    Review of Systems  Neurological: Negative for weakness and numbness.  All other systems reviewed and are negative.    Allergies  Keflex  Home Medications   Current Outpatient Rx  Name  Route  Sig  Dispense  Refill  . cephALEXin (KEFLEX) 500 MG capsule   Oral   Take 500 mg by mouth 2 (two) times daily.          Triage Vitals: BP 123/71  Pulse 98  Temp(Src) 98.4 F (36.9 C) (Oral)  Resp 16  Ht 5\' 10"  (1.778 m)  Wt 170 lb (77.111 kg)  BMI 24.39 kg/m2  SpO2  96%  Physical Exam  Nursing note and vitals reviewed. Constitutional: He is oriented to person, place, and time. He appears well-developed and well-nourished.  HENT:  Head: Normocephalic and atraumatic.  Eyes: EOM are normal.  Neck: Normal range of motion.  Cardiovascular: Normal rate.   Pulmonary/Chest: Effort normal.  Musculoskeletal: Normal range of motion.  Neurological: He is alert and oriented to person, place, and time.  Skin: Skin is warm and dry.  Left superior abscess with 3cm induration without fluctuance  Left inferior abscess with 2cm induration without fluctuance  Psychiatric: He has a normal mood and affect. His behavior is normal.    ED Course  Procedures (including critical care time)  DIAGNOSTIC STUDIES: Oxygen Saturation is 96% on RA, Adequate by my interpretation.    COORDINATION OF CARE: 10:05 PM- Will give antibiotics. Will give medication for pain. Discussed treatment plan with pt at bedside and pt agreed to plan.    10:57 PM-  INCISION AND DRAINAGE Performed by: Cherrie Distance PA-C Consent: Verbal consent obtained. Risks and benefits: risks, benefits and alternatives were discussed Type: abscess  Body area: left superior mid back region  Anesthesia: local infiltration  Incision was made with a scalpel.  Local anesthetic: lidocaine 2% without epinephrine  Anesthetic total: 1 ml  Complexity: complex  Blunt dissection to break up loculations  Drainage: purulent  Drainage amount: minimal  Packing material: 1/4 in iodoform gauze  Patient tolerance: Patient tolerated the procedure well with no immediate complications.   INCISION AND DRAINAGE Performed by: Cherrie Distance PA-C Consent: Verbal consent obtained. Risks and benefits: risks, benefits and alternatives were discussed Type: abscess  Body area: left inferior lower back  Anesthesia: local infiltration  Incision was made with a scalpel.  Local anesthetic: lidocaine 2%  without epinephrine  Anesthetic total: 1 ml  Complexity: complex Blunt dissection to break up loculations  Drainage: purulent  Drainage amount: minimal  Packing material: 1/4 in iodoform gauze  Patient tolerance: Patient tolerated the procedure well with no immediate complications.      Labs Review Labs Reviewed - No data to display Imaging Review No results found.  MDM  Epidermoid cyst,  Infected  Patient seen here with likely cellulitis, now with I&D, though infected cyst likely, there is no evidence of abscess.  Will provide pain control and MRSA coverage.  Will return in 2 days for follow up.  I personally performed the services described in this documentation, which was scribed in my presence. The recorded information has been reviewed and is accurate.   Izola Price Marisue Humble, [place] 11/12/12 2335

## 2012-11-12 NOTE — ED Notes (Signed)
Pt reports he noticed 2 abscess's to his back 5 days ago, pt reports he got a tattoo 10 days ago on his back, pt also c/o pain and swelling to his Right thumb starting today. Pt seen here last night for the same and started on ABX but reports his symptoms are getting worse

## 2012-11-12 NOTE — ED Notes (Signed)
Patient said he came to be seen last night for a "tattoo infection" which is what he was diagnosed with.  The patient said they did not look that bad last night.  The patient said he took two doses of cephalexin 500mg , po, bid and he has gotten worse.  He says he has been itching behind his ears, his right thumb is swollen and his back has gotten worse.  He came back to be seen again.

## 2012-11-14 ENCOUNTER — Emergency Department (HOSPITAL_COMMUNITY)
Admission: EM | Admit: 2012-11-14 | Discharge: 2012-11-14 | Disposition: A | Discharge status: Home / Self Care | Payer: Self-pay | Attending: Emergency Medicine | Admitting: Emergency Medicine

## 2012-11-14 ENCOUNTER — Encounter (HOSPITAL_COMMUNITY): Payer: Self-pay | Admitting: Emergency Medicine

## 2012-11-14 DIAGNOSIS — Z792 Long term (current) use of antibiotics: Secondary | ICD-10-CM | POA: Insufficient documentation

## 2012-11-14 DIAGNOSIS — L02212 Cutaneous abscess of back [any part, except buttock]: Secondary | ICD-10-CM

## 2012-11-14 DIAGNOSIS — L02219 Cutaneous abscess of trunk, unspecified: Secondary | ICD-10-CM | POA: Insufficient documentation

## 2012-11-14 DIAGNOSIS — Z87891 Personal history of nicotine dependence: Secondary | ICD-10-CM | POA: Insufficient documentation

## 2012-11-14 NOTE — ED Notes (Signed)
Pt. is here for wound recheck / packing removal , abscess incised / drained and packed here  2 days ago.

## 2012-11-14 NOTE — ED Provider Notes (Signed)
CSN: [id number]     Arrival date & time 11/14/12  2128 History   First MD Initiated Contact with Patient 11/14/12 2246     Chief Complaint  Patient presents with  . Wound Check   HPI  History provided by the patient and significant other. Patient is a 37 year old male returning for recheck of skin infections to his back. Patient was seen 2 days ago and had I&D's performed 2 areas of the back. Patient reports areas seem to be improving but are still tender especially with pressure on sitting or with touch. He has been changing bandages to keep the areas clean. His significant other reports that the areas look significantly improved following I&D treatments. Patient was initially taking Keflex but was having adverse reactions and is now taking Bactrim as prescribed. He denies any other aggravating or alleviating factors. No associated fever, chills or sweats.    History reviewed. No pertinent past medical history. History reviewed. No pertinent past surgical history. No family history on file. History  Substance Use Topics  . Smoking status: Former Smoker    Types: Cigarettes    Quit date: 02/12/2012  . Smokeless tobacco: Not on file  . Alcohol Use: Yes     Comment: socially    Review of Systems  Constitutional: Negative for fever, chills and diaphoresis.  All other systems reviewed and are negative.    Allergies  Keflex  Home Medications   Current Outpatient Rx  Name  Route  Sig  Dispense  Refill  . cephALEXin (KEFLEX) 500 MG capsule   Oral   Take 500 mg by mouth 2 (two) times daily. For 7 days         . HYDROcodone-acetaminophen (NORCO/VICODIN) 5-325 MG per tablet   Oral   Take 1 tablet by mouth every 4 (four) hours as needed for pain.   15 tablet   0   . sulfamethoxazole-trimethoprim (BACTRIM DS) 800-160 MG per tablet   Oral   Take 1 tablet by mouth 2 (two) times daily.   20 tablet   0    BP 114/67  Pulse 74  Temp(Src) 98.5 F (36.9 C) (Oral)  Resp 18   SpO2 98% Physical Exam  Nursing note and vitals reviewed. Constitutional: He is oriented to person, place, and time. He appears well-developed and well-nourished. No distress.  HENT:  Head: Normocephalic.  Cardiovascular: Normal rate and regular rhythm.   Pulmonary/Chest: Effort normal and breath sounds normal. No respiratory distress. He has no wheezes. He has no rales.  Musculoskeletal: Normal range of motion.  Neurological: He is alert and oriented to person, place, and time.  Skin:  2 areas to the left lower and mid back with signs of recent I&D. Areas appear to be healing well. There is slight erythema and induration around the upper location. No significant bleeding or drainage from either site. Mild tenderness.  Psychiatric: He has a normal mood and affect. His behavior is normal.    ED Course  Procedures   COORDINATION OF CARE:  Nursing notes reviewed. Vital signs reviewed. Initial pt interview and examination performed. Abscesses with recent I&Ds to back appeared to be healing well. Packing removed.  Discussed treatment plan with pt at bedside, which includes warm compresses and continued clean bandages. Pt agrees with plan.     MDM   1. Abscess of back        Angus Seller, PA-C 11/15/12 0405

## 2012-11-15 NOTE — ED Provider Notes (Signed)
Medical screening examination/treatment/procedure(s) were performed by non-physician practitioner and as supervising physician I was immediately available for consultation/collaboration.   Tremond Shimabukuro, MD 11/15/12 0656 

## 2013-01-17 ENCOUNTER — Encounter (HOSPITAL_COMMUNITY): Payer: Self-pay | Admitting: Emergency Medicine

## 2013-01-17 ENCOUNTER — Emergency Department (HOSPITAL_COMMUNITY)
Admission: EM | Admit: 2013-01-17 | Discharge: 2013-01-17 | Disposition: A | Discharge status: Home / Self Care | Payer: Self-pay | Attending: Emergency Medicine | Admitting: Emergency Medicine

## 2013-01-17 DIAGNOSIS — L02412 Cutaneous abscess of left axilla: Secondary | ICD-10-CM

## 2013-01-17 DIAGNOSIS — Z87891 Personal history of nicotine dependence: Secondary | ICD-10-CM | POA: Insufficient documentation

## 2013-01-17 DIAGNOSIS — IMO0002 Reserved for concepts with insufficient information to code with codable children: Secondary | ICD-10-CM | POA: Insufficient documentation

## 2013-01-17 MED ORDER — SULFAMETHOXAZOLE-TMP DS 800-160 MG PO TABS
1.0000 | ORAL_TABLET | Freq: Once | ORAL | Status: AC
  Administered 2013-01-17: 1 via ORAL
  Filled 2013-01-17: qty 1

## 2013-01-17 MED ORDER — SULFAMETHOXAZOLE-TRIMETHOPRIM 800-160 MG PO TABS: 1.0000 | ORAL_TABLET | Freq: Two times a day (BID) | ORAL | Status: DC

## 2013-01-17 MED ORDER — OXYCODONE-ACETAMINOPHEN 5-325 MG PO TABS
2.0000 | ORAL_TABLET | Freq: Once | ORAL | Status: AC
  Administered 2013-01-17: 2 via ORAL
  Filled 2013-01-17: qty 2

## 2013-01-17 MED ORDER — OXYCODONE-ACETAMINOPHEN 5-325 MG PO TABS: 1.0000 | ORAL_TABLET | ORAL | Status: DC | PRN

## 2013-01-17 NOTE — ED Provider Notes (Signed)
CSN: [id number]     Arrival date & time 01/17/13  1303 History   First MD Initiated Contact with Patient 01/17/13 1312     Chief Complaint  Patient presents with  . Abscess   (Consider location/radiation/quality/duration/timing/severity/associated sxs/prior Treatment) Patient is a 37 y.o. male presenting with abscess. The history is provided by the patient. No language interpreter was used.  Abscess Location:  Shoulder/arm Shoulder/arm abscess location:  L axilla Abscess quality: painful and redness   Abscess quality: not draining   Red streaking: yes   Duration:  2 days Pain details:    Quality:  Aching   Progression:  Worsening Ineffective treatments:  Draining/squeezing Associated symptoms: no fever and no nausea     History reviewed. No pertinent past medical history. History reviewed. No pertinent past surgical history. History reviewed. No pertinent family history. History  Substance Use Topics  . Smoking status: Former Smoker    Types: Cigarettes    Quit date: 02/12/2012  . Smokeless tobacco: Not on file  . Alcohol Use: Yes     Comment: socially    Review of Systems  Constitutional: Negative for fever.  Gastrointestinal: Negative for nausea.  Skin: Positive for color change.       See HPI.    Allergies  Keflex  Home Medications  No current outpatient prescriptions on file. BP 124/100  Pulse 87  Temp(Src) 98.9 F (37.2 C) (Oral)  Resp 20  SpO2 98% Physical Exam  Constitutional: He is oriented to person, place, and time. He appears well-developed and well-nourished.  Neck: Normal range of motion.  Pulmonary/Chest: Effort normal.  Musculoskeletal: Normal range of motion.  Neurological: He is alert and oriented to person, place, and time.  Skin: Skin is warm and dry.  Large, fluctuant, tender swelling in left axilla with redness that extends from axilla to lateral chest wall, and distally to medial upper arm. No active draining.  Psychiatric: He has a  normal mood and affect.    ED Course  Procedures (including critical care time) Labs Review Labs Reviewed - No data to display Imaging Review No results found.  EKG Interpretation   None      INCISION AND DRAINAGE Performed by: Elpidio Anis A Consent: Verbal consent obtained. Risks and benefits: risks, benefits and alternatives were discussed Type: abscess  Body area: left axilla  Anesthesia: local infiltration  Incision was made with a scalpel.  Local anesthetic: lidocaine 2% w/ epinephrine  Anesthetic total: 2 ml  Complexity: complex Blunt dissection to break up loculations  Drainage: purulent  Drainage amount: moderate  Packing material: 1/4 in iodoform gauze  Patient tolerance: Patient tolerated the procedure well with no immediate complications.    MDM  No diagnosis found. 1. Axillary abscess  Cutaneous abscess complicated by cellulitic infection. Treat with Keflex, 2 day recheck  Arnoldo Hooker, PA-C 01/17/13 1450

## 2013-01-17 NOTE — ED Notes (Signed)
Pt here with left sided axillary abscess x 2 days; pt sts hx of similar on back

## 2013-01-17 NOTE — ED Provider Notes (Signed)
Medical screening examination/treatment/procedure(s) were performed by non-physician practitioner and as supervising physician I was immediately available for consultation/collaboration.  Raffaela Ladley L Bayler Nehring, MD 01/17/13 2007 

## 2013-02-20 ENCOUNTER — Emergency Department (HOSPITAL_COMMUNITY)
Admission: EM | Admit: 2013-02-20 | Discharge: 2013-02-20 | Disposition: A | Discharge status: Home / Self Care | Payer: Self-pay | Attending: Emergency Medicine | Admitting: Emergency Medicine

## 2013-02-20 ENCOUNTER — Encounter (HOSPITAL_COMMUNITY): Payer: Self-pay | Admitting: Emergency Medicine

## 2013-02-20 DIAGNOSIS — Z881 Allergy status to other antibiotic agents status: Secondary | ICD-10-CM | POA: Insufficient documentation

## 2013-02-20 DIAGNOSIS — L02429 Furuncle of limb, unspecified: Secondary | ICD-10-CM | POA: Insufficient documentation

## 2013-02-20 DIAGNOSIS — L02416 Cutaneous abscess of left lower limb: Secondary | ICD-10-CM

## 2013-02-20 DIAGNOSIS — Z87891 Personal history of nicotine dependence: Secondary | ICD-10-CM | POA: Insufficient documentation

## 2013-02-20 DIAGNOSIS — Z79899 Other long term (current) drug therapy: Secondary | ICD-10-CM | POA: Insufficient documentation

## 2013-02-20 MED ORDER — SULFAMETHOXAZOLE-TRIMETHOPRIM 800-160 MG PO TABS: 1.0000 | ORAL_TABLET | Freq: Two times a day (BID) | ORAL | Status: DC

## 2013-02-20 MED ORDER — HYDROCODONE-ACETAMINOPHEN 5-325 MG PO TABS: 1.0000 | ORAL_TABLET | ORAL | Status: DC | PRN

## 2013-02-20 MED ORDER — OXYCODONE-ACETAMINOPHEN 5-325 MG PO TABS: 1.0000 | ORAL_TABLET | ORAL | Status: DC | PRN

## 2013-02-20 MED ORDER — OXYCODONE-ACETAMINOPHEN 5-325 MG PO TABS
1.0000 | ORAL_TABLET | Freq: Once | ORAL | Status: AC
  Administered 2013-02-20: 1 via ORAL
  Filled 2013-02-20: qty 1

## 2013-02-20 NOTE — ED Notes (Signed)
Vital signs stable. 

## 2013-02-20 NOTE — ED Notes (Signed)
Patient seen here twice for skin infection, initially diagnosed as tattoo infection, tattoo is on back.  Now presents with boils behind left knee and on back and in left arm pit.  Prescribed keflex, unable to tolerate

## 2013-02-20 NOTE — ED Notes (Signed)
The pt has multiple boils for 3 days.  He has a history of boils and being drained

## 2013-02-20 NOTE — Discharge Instructions (Signed)
Abscess An abscess is an infected area that contains a collection of pus and debris.It can occur in almost any part of the body. An abscess is also known as a furuncle or boil. CAUSES  An abscess occurs when tissue gets infected. This can occur from blockage of oil or sweat glands, infection of hair follicles, or a minor injury to the skin. As the body tries to fight the infection, pus collects in the area and creates pressure under the skin. This pressure causes pain. People with weakened immune systems have difficulty fighting infections and get certain abscesses more often.  SYMPTOMS Usually an abscess develops on the skin and becomes a painful mass that is red, warm, and tender. If the abscess forms under the skin, you may feel a moveable soft area under the skin. Some abscesses break open (rupture) on their own, but most will continue to get worse without care. The infection can spread deeper into the body and eventually into the bloodstream, causing you to feel ill.  DIAGNOSIS  Your caregiver will take your medical history and perform a physical exam. A sample of fluid may also be taken from the abscess to determine what is causing your infection. TREATMENT  Your caregiver may prescribe antibiotic medicines to fight the infection. However, taking antibiotics alone usually does not cure an abscess. Your caregiver may need to make a small cut (incision) in the abscess to drain the pus. In some cases, gauze is packed into the abscess to reduce pain and to continue draining the area. HOME CARE INSTRUCTIONS   Only take over-the-counter or prescription medicines for pain, discomfort, or fever as directed by your caregiver.  If you were prescribed antibiotics, take them as directed. Finish them even if you start to feel better.  If gauze is used, follow your caregiver's directions for changing the gauze.  To avoid spreading the infection:  Keep your draining abscess covered with a  bandage.  Wash your hands well.  Do not share personal care items, towels, or whirlpools with others.  Avoid skin contact with others.  Keep your skin and clothes clean around the abscess.  Keep all follow-up appointments as directed by your caregiver. SEEK MEDICAL CARE IF:   You have increased pain, swelling, redness, fluid drainage, or bleeding.  You have muscle aches, chills, or a general ill feeling.  You have a fever. MAKE SURE YOU:   Understand these instructions.  Will watch your condition.  Will get help right away if you are not doing well or get worse. Document Released: 11/03/2004 Document Revised: 07/26/2011 Document Reviewed: 04/08/2011 [employer] Patient Information 2014 [employer].  

## 2013-02-20 NOTE — ED Provider Notes (Signed)
CSN: [id number]     Arrival date & time 02/20/13  1505 History   First MD Initiated Contact with Patient 02/20/13 1642     Chief Complaint  Patient presents with  . boils    (Consider location/radiation/quality/duration/timing/severity/associated sxs/prior Treatment) Patient is a 38 y.o. male presenting with abscess. The history is provided by the patient. No language interpreter was used.  Abscess Associated symptoms: no fever   Associated symptoms comment:  Recurrent painful swollen areas to left axilla and left leg. Symptoms started several days ago. No fever. He has had limited drainage from left axillary lesion.    History reviewed. No pertinent past medical history. History reviewed. No pertinent past surgical history. No family history on file. History  Substance Use Topics  . Smoking status: Former Smoker    Types: Cigarettes    Quit date: 02/12/2012  . Smokeless tobacco: Not on file  . Alcohol Use: Yes     Comment: socially    Review of Systems  Constitutional: Negative for fever.  Skin:       See HPI.    Allergies  Keflex  Home Medications   Current Outpatient Rx  Name  Route  Sig  Dispense  Refill  . acetaminophen (TYLENOL) 500 MG tablet   Oral   Take 1,500 mg by mouth every 6 (six) hours as needed (pain).         . Turmeric 500 MG CAPS   Oral   Take 500 mg by mouth daily.          BP 126/79  Pulse 97  Temp(Src) 98.5 F (36.9 C) (Oral)  Resp 18  SpO2 98% Physical Exam  Constitutional: He is oriented to person, place, and time. He appears well-developed and well-nourished. No distress.  Neurological: He is alert and oriented to person, place, and time.  Skin:  Painsult, raised red areas x 3 left axilla c/w cutaneous abscess. Large draining red abscess posterior left knee. Tender indurated 2 cm x 2 cm lesion left posterolateral thigh c/w abscess.     ED Course  Procedures (including critical care time) Labs Review Labs Reviewed - No data  to display Imaging Review No results found.  EKG Interpretation   None     INCISION AND DRAINAGE Performed by: Elpidio AnisUPSTILL, Disha Cottam A Consent: Verbal consent obtained. Risks and benefits: risks, benefits and alternatives were discussed Type: abscess  Body area: left posterior knee  Anesthesia: local infiltration  Incision was made with a scalpel.  Local anesthetic: lidocaine 2% w/o epinephrine  Anesthetic total: 2 ml  Complexity: complex Blunt dissection to break up loculations  Drainage: purulent  Drainage amount: large  Packing material: 1/4 in iodoform gauze  Patient tolerance: Patient tolerated the procedure well with no immediate complications.      MDM  No diagnosis found. 1. 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 Pain managed.     Arnoldo HookerShari A Romonia Yanik, PA-C 02/20/13 1721

## 2013-02-20 NOTE — ED Notes (Signed)
4X4 with paper tape applied to wounds.

## 2013-02-21 ENCOUNTER — Telehealth: Payer: Self-pay

## 2013-02-21 NOTE — Telephone Encounter (Signed)
Message copied by Newell CoralWILLIAMS, Donal Lynam J on Thu Feb 21, 2013  2:05 PM ------      Message from: Illene RegulusNORINS, MICHAEL E      Created: Wed Feb 20, 2013  7:33 PM       Please put on schedule for 10:30 AM Friday 1/16 and notify patient.      Thanks ------

## 2013-02-21 NOTE — Telephone Encounter (Signed)
patinet with I&D abcess popliteal ED 1/14 and immediate follow up with PCP recommended.Offered next day appointment. Patient declined

## 2013-02-21 NOTE — Telephone Encounter (Signed)
Pt called back and stated he does not have health insurance currently.  He stated he is unprepared to pay the 145.00 upfront copay (for patient's without insurance).   He is hoping to find out if the appointment is urgent?  If not, he stated he will have to wait until he is more prepared financially.  (Unless we are able to waive the fee)

## 2013-02-21 NOTE — ED Provider Notes (Signed)
Medical screening examination/treatment/procedure(s) were performed by non-physician practitioner and as supervising physician I was immediately available for consultation/collaboration.  Keny Donald L Aubrielle Stroud, MD 02/21/13 0024 

## 2013-02-21 NOTE — Telephone Encounter (Signed)
Patient scheduled, lvmom to notify pt.  Will try again before the end of the day to confirm pt received msg.

## 2013-02-22 ENCOUNTER — Ambulatory Visit (INDEPENDENT_AMBULATORY_CARE_PROVIDER_SITE_OTHER): Payer: Self-pay | Admitting: Internal Medicine

## 2013-02-22 ENCOUNTER — Encounter: Payer: Self-pay | Admitting: Internal Medicine

## 2013-02-22 VITALS — BP 124/80 | HR 82 | Temp 97.7°F | Wt 170.0 lb

## 2013-02-22 DIAGNOSIS — L0390 Cellulitis, unspecified: Secondary | ICD-10-CM

## 2013-02-22 DIAGNOSIS — L0291 Cutaneous abscess, unspecified: Secondary | ICD-10-CM

## 2013-02-22 MED ORDER — RIFAMPIN 150 MG PO CAPS
300.0000 mg | ORAL_CAPSULE | Freq: Every day | ORAL | Status: DC
Start: 2013-02-22 — End: 2013-04-10

## 2013-02-22 NOTE — Patient Instructions (Signed)
Multiple MRSA skin abscesses - but not evidence of systemic disease, i.e. Bacteremia/sepsis  Plan Wound care - 2 or 3 times a day: warm compresses for 10-15 min.; wash with soap and water using a washrag; dry thoroughly; +/- triple antibiotic ointment at your choice; cover with a non-stick dressing, for the knee cover with a 4x4; wrap the knee with the coban  Antibiotic - Septra DS twice a day for 21 days                    Rifampin 300 mg twice for 21 days.   Pain APAP 1,000 mg three times a day or if you use percocet the APAP 325 each tablet counts towards your daily total   Call for Fever, enlarged lymph nodes or any trouble with tolerating the antibiotic  ROV - Monday - it will be no charge.

## 2013-02-22 NOTE — Progress Notes (Signed)
Subjective:    Patient ID: Mark Page, male    DOB: [birth year], 38 y.o.   MRN: [id number]  HPI Mark Page has had a series of skin infections starting with infection at tatoo site on his back - treated with oral antibiotic. He had a pimple in his left axilla which he manipulated and developed an abscess - to ED, had I&D with packing. The packing came out in 12 hrs and then he had multiple infected sites in the axilla. He returned to the ED: Needle drainage attempted and he was put on  Keflex. He then had recurrent abscess in the axilla with spontaneous drainage and it seemed to be healing but he now has recurrent lesions. He then developed additional lesions - left thigh and popliteal fossa - he had manipulated small lesions which then became infected. He returned to ED and had I&D popliteal lesion on the 14th and was discharged on Septa DS bid.  He returns today for follow up. The axillary lesion persists; the popliteal lesion has been red, angry, tender and continues to drain. He denies any fever, rigors, Nausea. He has been able to hydrate. He has medication for pain.   Microbiology from thigh lesion growing Staph Aureus, ss pending.   History reviewed. No pertinent past medical history. History reviewed. No pertinent past surgical history. History reviewed. No pertinent family history. History   Social History  . Marital Status: Single    Spouse Name: N/A    Number of Children: N/A  . Years of Education: N/A   Occupational History  . Not on file.   Social History Main Topics  . Smoking status: Former Smoker    Types: Cigarettes    Quit date: 02/12/2012  . Smokeless tobacco: Not on file  . Alcohol Use: Yes     Comment: socially  . Drug Use: No  . Sexual Activity: Not on file   Other Topics Concern  . Not on file   Social History Narrative  . No narrative on file    Current Outpatient Prescriptions on File Prior to Visit  Medication Sig Dispense Refill  .  oxyCODONE-acetaminophen (PERCOCET/ROXICET) 5-325 MG per tablet Take 1-2 tablets by mouth every 4 (four) hours as needed for severe pain.  20 tablet  0  . sulfamethoxazole-trimethoprim (SEPTRA DS) 800-160 MG per tablet Take 1 tablet by mouth every 12 (twelve) hours.  28 tablet  0  . acetaminophen (TYLENOL) 500 MG tablet Take 1,500 mg by mouth every 6 (six) hours as needed (pain).      . Turmeric 500 MG CAPS Take 500 mg by mouth daily.       No current facility-administered medications on file prior to visit.     Review of Systems System review is negative for any constitutional, cardiac, pulmonary, GI or neuro symptoms or complaints other than as described in the HPI.     Objective:   Physical Exam Filed Vitals:   02/22/13 1052  BP: 124/80  Pulse: 82  Temp: 97.7 F (36.5 C)   Gen'l slender man in no acute distress; oriented to place; no in pain. Cor - Regular rate Pulm - normla respirations Derm - left axilla with a central lesion - red, indurated with a diameter of 2 cm that has come to a head centrally without drainage. Lesion left thigh red, flat,  1x1 cm,no drainage. Left popliteal fossa - beefy red, raised abscess with central opening, warm to the touch and tender with frank pus  expressed with minimally manipulation.  MSK - Left shoulder with full ROM, left knee w/ full ROM, no effusion, no erythema Nodes - no inguinal nodes.  Discussed treatment options with ID on duty for [facility] consults - Dr. Luciana Axe       Assessment & Plan:

## 2013-02-22 NOTE — Progress Notes (Signed)
Pre visit review using our clinic review tool, if applicable. No additional management support is needed unless otherwise documented below in the visit note. 

## 2013-02-23 ENCOUNTER — Telehealth (HOSPITAL_BASED_OUTPATIENT_CLINIC_OR_DEPARTMENT_OTHER): Payer: Self-pay | Admitting: Emergency Medicine

## 2013-02-23 ENCOUNTER — Telehealth: Payer: Self-pay | Admitting: Internal Medicine

## 2013-02-23 DIAGNOSIS — L0291 Cutaneous abscess, unspecified: Secondary | ICD-10-CM | POA: Insufficient documentation

## 2013-02-23 LAB — CULTURE, ROUTINE-ABSCESS: SPECIAL REQUESTS: NORMAL

## 2013-02-23 NOTE — Assessment & Plan Note (Signed)
3rd week Jan '15: boil left axilla, minor boil left mid-thigh, furuncle left popliteal fossa. Mulitple encounters for this problem: ED x 3, OV x1. Frank infection left popliteal fossa but no evidence of deep penetration, no involvement of the knee, so signs or symptoms of sepsis, able to walk and bear weight. Hemodynamically stable.  Plan Antibiotics: Septra DS bid x 21 days; Rifampin 300 mg bid x 21 days  Wound care: see AVS  Soon follow up - 3 days.

## 2013-02-23 NOTE — ED Notes (Signed)
[facility] lab called + abscess culture + MRSA. RX'd Septra DS, sensitive to same.Will call and notify pt. Unsuccessful attempt to contact patient by phone, will try again later.

## 2013-02-23 NOTE — Telephone Encounter (Signed)
Called and left msg: query as to wound care. For problems call the answering service and be put through to me. Otherwise keep Monday appointment

## 2013-02-24 NOTE — ED Notes (Signed)
Post ED Visit - Positive Culture Follow-up  Culture report reviewed by antimicrobial stewardship pharmacist: []  Wes Dulaney, Pharm.D., BCPS []  Celedonio MiyamotoJeremy Frens, Pharm.D., BCPS []  Georgina PillionElizabeth Martin, Pharm.D., BCPS [x]  QuinlanMinh Pham, VermontPharm.D., BCPS, AAHIVP []  Estella HuskMichelle Turner, Pharm.D., BCPS, AAHIVP  Positive abscess culture Treated with Sulfa-Trimeth, organism sensitive to the same and no further patient follow-up is required at this time.  Zeb ComfortHolland, Harry Shuck 02/24/2013, 5:13 PM

## 2013-02-25 ENCOUNTER — Encounter: Payer: Self-pay | Admitting: Internal Medicine

## 2013-02-25 ENCOUNTER — Ambulatory Visit (INDEPENDENT_AMBULATORY_CARE_PROVIDER_SITE_OTHER): Payer: Self-pay | Admitting: Internal Medicine

## 2013-02-25 VITALS — BP 112/60 | HR 75 | Temp 98.3°F | Wt 172.0 lb

## 2013-02-25 DIAGNOSIS — L0390 Cellulitis, unspecified: Secondary | ICD-10-CM

## 2013-02-25 DIAGNOSIS — L0291 Cutaneous abscess, unspecified: Secondary | ICD-10-CM

## 2013-02-25 NOTE — Assessment & Plan Note (Signed)
Wound check - left popliteal fossa abscess looks much, much better.  Plan Continue wound care as instructed  Full 3 weeks of septra DS and Rifampin.  ROV prn

## 2013-02-25 NOTE — Progress Notes (Signed)
Pre visit review using our clinic review tool, if applicable. No additional management support is needed unless otherwise documented below in the visit note. 

## 2013-02-27 NOTE — ED Notes (Unsigned)
Patient is out of town working and his co-worker will notify him to call us back.

## 2013-04-10 ENCOUNTER — Emergency Department (HOSPITAL_COMMUNITY)
Admission: EM | Admit: 2013-04-10 | Discharge: 2013-04-10 | Disposition: A | Discharge status: Home / Self Care | Payer: Self-pay | Attending: Emergency Medicine | Admitting: Emergency Medicine

## 2013-04-10 ENCOUNTER — Encounter (HOSPITAL_COMMUNITY): Payer: Self-pay | Admitting: Emergency Medicine

## 2013-04-10 DIAGNOSIS — M549 Dorsalgia, unspecified: Secondary | ICD-10-CM

## 2013-04-10 DIAGNOSIS — Z792 Long term (current) use of antibiotics: Secondary | ICD-10-CM | POA: Insufficient documentation

## 2013-04-10 DIAGNOSIS — Z79899 Other long term (current) drug therapy: Secondary | ICD-10-CM | POA: Insufficient documentation

## 2013-04-10 DIAGNOSIS — M545 Low back pain, unspecified: Secondary | ICD-10-CM | POA: Insufficient documentation

## 2013-04-10 DIAGNOSIS — J3489 Other specified disorders of nose and nasal sinuses: Secondary | ICD-10-CM | POA: Insufficient documentation

## 2013-04-10 DIAGNOSIS — Z87891 Personal history of nicotine dependence: Secondary | ICD-10-CM | POA: Insufficient documentation

## 2013-04-10 DIAGNOSIS — R1110 Vomiting, unspecified: Secondary | ICD-10-CM | POA: Insufficient documentation

## 2013-04-10 DIAGNOSIS — Z8659 Personal history of other mental and behavioral disorders: Secondary | ICD-10-CM | POA: Insufficient documentation

## 2013-04-10 DIAGNOSIS — J45909 Unspecified asthma, uncomplicated: Secondary | ICD-10-CM | POA: Insufficient documentation

## 2013-04-10 MED ORDER — CYCLOBENZAPRINE HCL 5 MG PO TABS: 5.0000 mg | ORAL_TABLET | Freq: Two times a day (BID) | ORAL | Status: DC | PRN

## 2013-04-10 MED ORDER — HYDROCODONE-ACETAMINOPHEN 5-325 MG PO TABS: 1.0000 | ORAL_TABLET | ORAL | Status: DC | PRN

## 2013-04-10 NOTE — ED Notes (Signed)
Pt c/o lower back pain worsening x 1 week.

## 2013-04-10 NOTE — ED Provider Notes (Signed)
CSN: [id number]     Arrival date & time 04/10/13  1413 History  This chart was scribed for non-physician practitioner Teressa Lower, NP working with Lyanne Co, MD by Dorothey Baseman, ED Scribe. This patient was seen in room TR05C/TR05C and the patient's care was started at 3:22 PM.    Chief Complaint  Patient presents with  . Back Pain   The history is provided by the patient. No language interpreter was used.   HPI Comments: Mark Page is a 38 y.o. male who presents to the Emergency Department complaining of a constant pain to the lower back that he states radiates down the bilateral legs onset 1 week ago. Patient states that the pain has been progressively worsening and is exacerbated with flexion. He denies any potential injury or trauma to the area. He denies taking any medications at home to treat his symptoms. Patient states that he has recently been ill with URI-like symptoms including dry cough and congestion. He denies fever, urinary complaints. He reports an allergy to Keflex. Patient has a history of asthma.   Past Medical History  Diagnosis Date  . Asthma   . Other bipolar disorders   . Tobacco use disorder    History reviewed. No pertinent past surgical history. No family history on file. History  Substance Use Topics  . Smoking status: Former Smoker    Types: Cigarettes    Quit date: 02/12/2012  . Smokeless tobacco: Not on file  . Alcohol Use: Yes     Comment: socially    Review of Systems  Constitutional: Negative for fever.  HENT: Positive for congestion.   Respiratory: Positive for cough.   Genitourinary: Negative for difficulty urinating.  Musculoskeletal: Positive for back pain.  All other systems reviewed and are negative.    Allergies  Keflex  Home Medications   Current Outpatient Rx  Name  Route  Sig  Dispense  Refill  . acetaminophen (TYLENOL) 500 MG tablet   Oral   Take 1,500 mg by mouth every 6 (six) hours as needed (pain).         Marland Kitchen  oxyCODONE-acetaminophen (PERCOCET/ROXICET) 5-325 MG per tablet   Oral   Take 1-2 tablets by mouth every 4 (four) hours as needed for severe pain.   20 tablet   0   . rifampin (RIFADIN) 150 MG capsule   Oral   Take 2 capsules (300 mg total) by mouth daily.   14 capsule   3   . sulfamethoxazole-trimethoprim (SEPTRA DS) 800-160 MG per tablet   Oral   Take 1 tablet by mouth every 12 (twelve) hours.   28 tablet   0   . Turmeric 500 MG CAPS   Oral   Take 500 mg by mouth daily.          Triage Vitals: BP 127/75  Pulse 66  Temp(Src) 97.8 F (36.6 C) (Oral)  Resp 18  Ht 5\' 7"  (1.702 m)  Wt 166 lb 1.6 oz (75.342 kg)  BMI 26.01 kg/m2  SpO2 97%  Physical Exam  Nursing note and vitals reviewed. Constitutional: He is oriented to person, place, and time. He appears well-developed and well-nourished. No distress.  HENT:  Head: Normocephalic and atraumatic.  Eyes: Conjunctivae are normal.  Neck: Normal range of motion. Neck supple.  Cardiovascular: Normal rate, regular rhythm and normal heart sounds.   Pulmonary/Chest: Effort normal and breath sounds normal. No respiratory distress.  Abdominal: He exhibits no distension.  Musculoskeletal: Normal range of  motion.  No tenderness to palpation to the midline or paraspinal lumbar region.   Neurological: He is alert and oriented to person, place, and time.  Skin: Skin is warm and dry.  Psychiatric: He has a normal mood and affect. His behavior is normal.    ED Course  Procedures (including critical care time)  DIAGNOSTIC STUDIES: Oxygen Saturation is 97% on room air, normal by my interpretation.    COORDINATION OF CARE: 3:30 PM- Discussed that imaging will not be necessary today in the ED. Will discharge patient with symptomatic care including pain medication and muscle relaxants. Advised patient to follow up with the referred orthopedist if symptoms do not improve in about a week to possibly receive an MRI, if clinically  appropriate. Advised patient to apply heat to the area at home as needed. Discussed treatment plan with patient at bedside and patient verbalized agreement.     Labs Review Labs Reviewed - No data to display Imaging Review No results found.   EKG Interpretation None      MDM   Final diagnoses:  Back pain    Pt not having any neuro deficits.Pt is okay to treat symptomatically and follow up with ortho  I personally performed the services described in this documentation, which was scribed in my presence. The recorded information has been reviewed and is accurate.     Teressa LowerVrinda Kemo Spruce, NP 04/10/13 [phone]

## 2013-04-10 NOTE — Discharge Instructions (Signed)
Back Pain, Adult Low back pain is very common. About 1 in 5 people have back pain.The cause of low back pain is rarely dangerous. The pain often gets better over time.About half of people with a sudden onset of back pain feel better in just 2 weeks. About 8 in 10 people feel better by 6 weeks.  CAUSES Some common causes of back pain include:  Strain of the muscles or ligaments supporting the spine.  Wear and tear (degeneration) of the spinal discs.  Arthritis.  Direct injury to the back. DIAGNOSIS Most of the time, the direct cause of low back pain is not known.However, back pain can be treated effectively even when the exact cause of the pain is unknown.Answering your caregiver's questions about your overall health and symptoms is one of the most accurate ways to make sure the cause of your pain is not dangerous. If your caregiver needs more information, he or she may order lab work or imaging tests (X-rays or MRIs).However, even if imaging tests show changes in your back, this usually does not require surgery. HOME CARE INSTRUCTIONS For many people, back pain returns.Since low back pain is rarely dangerous, it is often a condition that people can learn to manageon their own.   Remain active. It is stressful on the back to sit or stand in one place. Do not sit, drive, or stand in one place for more than 30 minutes at a time. Take short walks on level surfaces as soon as pain allows.Try to increase the length of time you walk each day.  Do not stay in bed.Resting more than 1 or 2 days can delay your recovery.  Do not avoid exercise or work.Your body is made to move.It is not dangerous to be active, even though your back may hurt.Your back will likely heal faster if you return to being active before your pain is gone.  Pay attention to your body when you bend and lift. Many people have less discomfortwhen lifting if they bend their knees, keep the load close to their bodies,and  avoid twisting. Often, the most comfortable positions are those that put less stress on your recovering back.  Find a comfortable position to sleep. Use a firm mattress and lie on your side with your knees slightly bent. If you lie on your back, put a pillow under your knees.  Only take over-the-counter or prescription medicines as directed by your caregiver. Over-the-counter medicines to reduce pain and inflammation are often the most helpful.Your caregiver may prescribe muscle relaxant drugs.These medicines help dull your pain so you can more quickly return to your normal activities and healthy exercise.  Put ice on the injured area.  Put ice in a plastic bag.  Place a towel between your skin and the bag.  Leave the ice on for 15-20 minutes, 03-04 times a day for the first 2 to 3 days. After that, ice and heat may be alternated to reduce pain and spasms.  Ask your caregiver about trying back exercises and gentle massage. This may be of some benefit.  Avoid feeling anxious or stressed.Stress increases muscle tension and can worsen back pain.It is important to recognize when you are anxious or stressed and learn ways to manage it.Exercise is a great option. SEEK MEDICAL CARE IF:  You have pain that is not relieved with rest or medicine.  You have pain that does not improve in 1 week.  You have new symptoms.  You are generally not feeling well. SEEK   IMMEDIATE MEDICAL CARE IF:   You have pain that radiates from your back into your legs.  You develop new bowel or bladder control problems.  You have unusual weakness or numbness in your arms or legs.  You develop nausea or vomiting.  You develop abdominal pain.  You feel faint. Document Released: 01/24/2005 Document Revised: 07/26/2011 Document Reviewed: 06/14/2010 [employer] Patient Information 2014 [employer].  

## 2013-04-11 NOTE — ED Provider Notes (Signed)
Medical screening examination/treatment/procedure(s) were performed by non-physician practitioner and as supervising physician I was immediately available for consultation/collaboration.   EKG Interpretation None        Keats Kingry M Avery Klingbeil, MD 04/11/13 2333 

## 2013-06-27 ENCOUNTER — Encounter: Payer: Self-pay | Admitting: Internal Medicine

## 2013-06-27 ENCOUNTER — Ambulatory Visit (INDEPENDENT_AMBULATORY_CARE_PROVIDER_SITE_OTHER): Payer: Self-pay | Admitting: Internal Medicine

## 2013-06-27 VITALS — BP 136/86 | HR 89 | Temp 98.7°F | Wt 164.2 lb

## 2013-06-27 DIAGNOSIS — H9209 Otalgia, unspecified ear: Secondary | ICD-10-CM

## 2013-06-27 DIAGNOSIS — J029 Acute pharyngitis, unspecified: Secondary | ICD-10-CM

## 2013-06-27 DIAGNOSIS — H9202 Otalgia, left ear: Secondary | ICD-10-CM

## 2013-06-27 MED ORDER — PREDNISONE 20 MG PO TABS: 20.0000 mg | ORAL_TABLET | Freq: Two times a day (BID) | ORAL | Status: DC

## 2013-06-27 MED ORDER — CLARITHROMYCIN ER 500 MG PO TB24: 1000.0000 mg | ORAL_TABLET | Freq: Every day | ORAL | Status: DC

## 2013-06-27 NOTE — Patient Instructions (Signed)
Zicam Melts or Zinc lozenges as per package label for scratchy throat . Complementary options include  vitamin C 2000 mg daily; & Echinacea for 4-7 days.      Plain Mucinex (NOT D) for thick secretions ;force NON dairy fluids .   Nasal cleansing in the shower as discussed with lather of mild shampoo.After 10 seconds wash off lather while  exhaling through nostrils. Make sure that all residual soap is removed to prevent irritation.  Flonase OR Nasacort AQ 1 spray in each nostril twice a day as needed. Use the "crossover" technique into opposite nostril spraying toward opposite ear @ 45 degree angle, not straight up into nostril.  Use a Neti pot daily only  as needed for significant sinus congestion; going from open side to congested side . Plain Allegra (NOT D )  160 daily , Loratidine 10 mg , OR Zyrtec 10 mg @ bedtime  as needed for itchy eyes & sneezing.

## 2013-06-27 NOTE — Progress Notes (Signed)
Pre visit review using our clinic review tool, if applicable. No additional management support is needed unless otherwise documented below in the visit note. 

## 2013-06-27 NOTE — Progress Notes (Signed)
   Subjective:    Patient ID: Mark Page, male    DOB: 18-Oct-1975, 38 y.o.   MRN: [id number]  HPI   Symptoms began 06/26/13 in the morning as sore throat. This is described as pain or swelling of the left side only. This was associated with otic pain as well  He feels he's had fever, chills, sweats. He did not actually take his temperature  His 38 year-old has been sick and was seen in the emergency room recently.  Additionally he has seasonal allergies and has had clear drainage after pollen exposure  Nonsteroidals and Claritin have been of some benefit  He also describes some myalgias and arthralgias. He quit smoking 2 years ago. He had asthma as an adult but this resolved.    Review of Systems  He specifically denies frontal headache, facial pain, dental pain, nasal purulence, otic discharge, or productive cough.  He also does not have significant itchy, watery eyes or sneezing despite his history of extrinsic allergies       Objective:   Physical Exam General appearance:Thin but well nourished; no acute distress or increased work of breathing is presen  He does have a nasal post as well as large earlobe inserts.  He appears fatigued but not in acute distress  Eyes: No conjunctival inflammation or lid edema is present. There is no scleral icterus.  Ears: Tympanic membranes are dull without bulging or erythema.   Nose:  External nasal examination shows no deformity or inflammation. Nasal mucosa are pink and moist without lesions or exudates. No septal dislocation or deviation.No obstruction to airflow.   Oral exam: Dental hygiene is good; lips and gums are healthy appearing.  There is severe erythema of the throat with tonsillar enlargement. There is tender submandibular lymph node enlarged on the left. He also has shotty bilateral posterior cervical lymph nodes, greater on the left than the right  Neck:  No deformities, thyromegaly, masses, or tenderness noted.    Supple with full range of motion without pain.   Heart:  Normal rate and regular rhythm. S1 and S2 normal without gallop, murmur, click, rub or other extra sounds.   Lungs:Chest clear to auscultation; no wheezes, rhonchi,rales ,or rubs present.No increased work of breathing.    No organomegaly or masses  present.  Extremities:  No cyanosis, edema, or clubbing  noted    Skin: Warm & dry w/o jaundice or tenting.         Assessment & Plan:  #1 severe pharyngitis with referred pain to the left ear. R/O early parapharyngeal abscess  #2 Keflex allergy  #3 history of seasonal rhinitis  See orders and recommendations

## 2015-12-23 ENCOUNTER — Emergency Department (HOSPITAL_COMMUNITY)
Admission: EM | Admit: 2015-12-23 | Discharge: 2015-12-23 | Disposition: A | Discharge status: Home / Self Care | Payer: Self-pay | Attending: Emergency Medicine | Admitting: Emergency Medicine

## 2015-12-23 ENCOUNTER — Emergency Department (HOSPITAL_COMMUNITY): Payer: Self-pay

## 2015-12-23 ENCOUNTER — Encounter (HOSPITAL_COMMUNITY): Payer: Self-pay | Admitting: Emergency Medicine

## 2015-12-23 DIAGNOSIS — Y999 Unspecified external cause status: Secondary | ICD-10-CM | POA: Insufficient documentation

## 2015-12-23 DIAGNOSIS — L0211 Cutaneous abscess of neck: Secondary | ICD-10-CM | POA: Insufficient documentation

## 2015-12-23 DIAGNOSIS — J45909 Unspecified asthma, uncomplicated: Secondary | ICD-10-CM | POA: Insufficient documentation

## 2015-12-23 DIAGNOSIS — Y939 Activity, unspecified: Secondary | ICD-10-CM | POA: Insufficient documentation

## 2015-12-23 DIAGNOSIS — R55 Syncope and collapse: Secondary | ICD-10-CM | POA: Insufficient documentation

## 2015-12-23 DIAGNOSIS — L0291 Cutaneous abscess, unspecified: Secondary | ICD-10-CM

## 2015-12-23 DIAGNOSIS — Z87891 Personal history of nicotine dependence: Secondary | ICD-10-CM | POA: Insufficient documentation

## 2015-12-23 DIAGNOSIS — W19XXXA Unspecified fall, initial encounter: Secondary | ICD-10-CM | POA: Insufficient documentation

## 2015-12-23 DIAGNOSIS — Y929 Unspecified place or not applicable: Secondary | ICD-10-CM | POA: Insufficient documentation

## 2015-12-23 LAB — URINALYSIS, ROUTINE W REFLEX MICROSCOPIC
Bilirubin Urine: NEGATIVE
Glucose, UA: NEGATIVE mg/dL
Hgb urine dipstick: NEGATIVE
Ketones, ur: NEGATIVE mg/dL
LEUKOCYTES UA: NEGATIVE
Nitrite: NEGATIVE
PROTEIN: NEGATIVE mg/dL
Specific Gravity, Urine: 1.025 (ref 1.005–1.030)
pH: 5 (ref 5.0–8.0)

## 2015-12-23 LAB — BASIC METABOLIC PANEL
Anion gap: 8 (ref 5–15)
BUN: 10 mg/dL (ref 6–20)
CALCIUM: 9.2 mg/dL (ref 8.9–10.3)
CO2: 27 mmol/L (ref 22–32)
CREATININE: 0.83 mg/dL (ref 0.61–1.24)
Chloride: 103 mmol/L (ref 101–111)
GFR calc non Af Amer: >60 mL/min (ref >60)
Glucose, Bld: 97 mg/dL (ref 65–99)
Potassium: 3.8 mmol/L (ref 3.5–5.1)
SODIUM: 138 mmol/L (ref 135–145)

## 2015-12-23 LAB — CBC
HCT: 37.9 % — ABNORMAL LOW (ref 39.0–52.0)
Hemoglobin: 12.9 g/dL — ABNORMAL LOW (ref 13.0–17.0)
MCH: 29.6 pg (ref 26.0–34.0)
MCHC: 34 g/dL (ref 30.0–36.0)
MCV: 86.9 fL (ref 78.0–100.0)
PLATELETS: 241 10*3/uL (ref 150–400)
RBC: 4.36 MIL/uL (ref 4.22–5.81)
RDW: 12.4 % (ref 11.5–15.5)
WBC: 5.4 10*3/uL (ref 4.0–10.5)

## 2015-12-23 LAB — CBG MONITORING, ED: GLUCOSE-CAPILLARY: 93 mg/dL (ref 65–99)

## 2015-12-23 MED ORDER — SODIUM CHLORIDE 0.9 % IV BOLUS (SEPSIS)
1000.0000 mL | Freq: Once | INTRAVENOUS | Status: AC
  Administered 2015-12-23: 1000 mL via INTRAVENOUS

## 2015-12-23 NOTE — ED Provider Notes (Signed)
[facility]-EMERGENCY DEPT Provider Note   CSN: [id number] Arrival date & time: 12/23/15  0330     History   Chief Complaint Chief Complaint  Patient presents with  . Abscess  . Loss of Consciousness    HPI Mark Page is a 40 y.o. male who presents with syncopal episode and abscess. PMH significant for prior skin infections. Girlfriend is at bedside who helps provide history. Patient states that at about 3AM he was standing at the sink she he felt the sudden urge to have a bowel movement and then lost consciousness. His girlfriend, who was with him at the time, estimates he was unconscious for 15-30 seconds. She denies any rhythmic jerking, tongue biting, bowel/bladder incontinence. He did hit his head. When he regained consciousness he felt fatigued and had a tingling feeling in his extremities. He denies any pain currently. He has changed his diet recently - stopped eating red meat, but otherwise has been eating and drinking.  Additionally he states he has had an abscess on the back of his neck for the past week. It was worse earlier on in the week. Girlfriend states she has been putting honey and tumeric on it. It was draining and now he is worried that it is getting worse even though pain is better.   HPI  Past Medical History:  Diagnosis Date  . Asthma   . Other bipolar disorders   . Tobacco use disorder     Patient Active Problem List   Diagnosis Date Noted  . Abscess of skin and subcutaneous tissue 02/23/2013  . TOBACCO USER 07/25/2008  . ASTHMA 10/26/2006  . DISORDER, BIPOLAR NEC 09/01/2006    History reviewed. No pertinent surgical history.     Home Medications    Prior to Admission medications   Medication Sig Start Date End Date Taking? Authorizing Provider  clarithromycin (BIAXIN XL) 500 MG 24 hr tablet Take 2 tablets (1,000 mg total) by mouth daily. 06/27/13   Pecola LawlessWilliam F Hopper, MD  predniSONE (DELTASONE) 20 MG tablet Take 1 tablet (20 mg total) by mouth 2  (two) times daily. 06/27/13   Pecola LawlessWilliam F Hopper, MD    Family History No family history on file.  Social History Social History  Substance Use Topics  . Smoking status: Former Smoker    Types: Cigarettes    Quit date: 02/12/2012  . Smokeless tobacco: Never Used  . Alcohol use Yes     Comment: socially     Allergies   Keflex [cephalexin]   Review of Systems Review of Systems  Constitutional: Positive for fatigue. Negative for appetite change, chills and fever.  Respiratory: Negative for shortness of breath.   Cardiovascular: Negative for chest pain.  Gastrointestinal: Negative for abdominal pain.  Musculoskeletal: Negative for gait problem.  Skin:       Abscess  Neurological: Positive for syncope. Negative for dizziness, seizures, weakness, light-headedness and headaches.     Physical Exam Updated Vital Signs BP 128/78 (BP Location: Right Arm)   Pulse 79   Temp 97.7 F (36.5 C) (Oral)   Resp 18   Ht 5\' 10"  (1.778 m)   Wt 77.1 kg   SpO2 99%   BMI 24.39 kg/m   Physical Exam  Constitutional: He is oriented to person, place, and time. He appears well-developed and well-nourished. No distress.  Sleeping  HENT:  Head: Normocephalic and atraumatic.  Eyes: Conjunctivae are normal. Pupils are equal, round, and reactive to light. Right eye exhibits no discharge. Left eye  exhibits no discharge. No scleral icterus.  Neck: Normal range of motion.  Cardiovascular: Normal rate.   Pulmonary/Chest: Effort normal. No respiratory distress.  Abdominal: He exhibits no distension.  Neurological: He is alert and oriented to person, place, and time.  Lying on stretcher in NAD. GCS 15. Speaks in a clear voice. Cranial nerves II through XII grossly intact. 5/5 strength in all extremities. Sensation fully intact.  Bilateral finger-to-nose intact. Normal gait   Skin: Skin is warm and dry.  2cm ulcerated skin which appears to be healing   Psychiatric: He has a normal mood and affect. His  behavior is normal.  Nursing note and vitals reviewed.    ED Treatments / Results  Labs (all labs ordered are listed, but only abnormal results are displayed) Labs Reviewed  CBC - Abnormal; Notable for the following:       Result Value   Hemoglobin 12.9 (*)    HCT 37.9 (*)    All other components within normal limits  BASIC METABOLIC PANEL  URINALYSIS, ROUTINE W REFLEX [facility]CROSCOPIC [facility])  CBG MONITORING, ED    EKG  EKG Interpretation None       Radiology Ct Head Wo Contrast  Result Date: 12/23/2015 CLINICAL DATA:  40 year old male with history of syncope complicated by a fall earlier today. EXAM: CT HEAD WITHOUT CONTRAST TECHNIQUE: Contiguous axial images were obtained from the base of the skull through the vertex without intravenous contrast. COMPARISON:  None. FINDINGS: Brain: No evidence of acute infarction, hemorrhage, hydrocephalus, extra-axial collection or mass lesion/mass effect. Vascular: No hyperdense vessel or unexpected calcification. Skull: Normal. Negative for fracture or focal lesion. Sinuses/Orbits: No acute finding. Other: None. IMPRESSION: 1. No acute intracranial abnormalities. 2. The appearance of the brain is normal. Electronically Signed   By: Trudie Reedaniel  Entrikin M.D.   On: 12/23/2015 08:59    Procedures Procedures (including critical care time)  Medications Ordered in ED Medications  sodium chloride 0.9 % bolus 1,000 mL (0 mLs Intravenous Stopped 12/23/15 0903)     Initial Impression / Assessment and Plan / ED Course  I have reviewed the triage vital signs and the nursing notes.  Pertinent labs & imaging results that were available during my care of the patient were reviewed by me and considered in my medical decision making (see chart for details).  Clinical Course    40 year old male with syncope and healing abscess. His blood pressure is somewhat soft. IVF given with some improvement. Orthostatics are negative. Most likely his syncopal  episode was vasovagal with the history of having the sensation of needed to have a BM. He was on the cardiac monitor for 6 hours with no events. EKG is NSR. Labs are unremarkable. UA is clean. CT head is negative. Abscess appears to be healing well. Advised wound care at home. Patient is NAD, non-toxic, with stable VS. Patient is informed of clinical course, understands medical decision making process, and agrees with plan. Opportunity for questions provided and all questions answered. Return precautions given.   Final Clinical Impressions(s) / ED Diagnoses   Final diagnoses:  Syncope, unspecified syncope type  Abscess    New Prescriptions There are no discharge medications for this patient.    Bethel BornKelly Marie Keeleigh Terris, PA-C 12/24/15 1128    Gilda Creasehristopher J Pollina, MD 01/02/16 [phone]

## 2015-12-23 NOTE — ED Notes (Signed)
Pt. Ambulatory at discharge with no questions. NAD.

## 2015-12-23 NOTE — ED Triage Notes (Signed)
Pt reports abscess to posterior skull x8 days, thinks its infected. Syncopal episode just prior to admitting to ED. Pt ambulatory and alert x4. Denies fevers.

## 2019-09-12 ENCOUNTER — Encounter (HOSPITAL_COMMUNITY): Payer: Self-pay | Admitting: Emergency Medicine

## 2019-09-12 ENCOUNTER — Emergency Department (HOSPITAL_COMMUNITY)
Admission: EM | Admit: 2019-09-12 | Discharge: 2019-09-12 | Disposition: A | Discharge status: Home / Self Care | Payer: Self-pay | Attending: Emergency Medicine | Admitting: Emergency Medicine

## 2019-09-12 ENCOUNTER — Other Ambulatory Visit: Payer: Self-pay

## 2019-09-12 DIAGNOSIS — Y939 Activity, unspecified: Secondary | ICD-10-CM | POA: Insufficient documentation

## 2019-09-12 DIAGNOSIS — W268XXA Contact with other sharp object(s), not elsewhere classified, initial encounter: Secondary | ICD-10-CM | POA: Insufficient documentation

## 2019-09-12 DIAGNOSIS — Y999 Unspecified external cause status: Secondary | ICD-10-CM | POA: Insufficient documentation

## 2019-09-12 DIAGNOSIS — J45909 Unspecified asthma, uncomplicated: Secondary | ICD-10-CM | POA: Insufficient documentation

## 2019-09-12 DIAGNOSIS — Z87891 Personal history of nicotine dependence: Secondary | ICD-10-CM | POA: Insufficient documentation

## 2019-09-12 DIAGNOSIS — S0181XA Laceration without foreign body of other part of head, initial encounter: Secondary | ICD-10-CM | POA: Insufficient documentation

## 2019-09-12 DIAGNOSIS — Y929 Unspecified place or not applicable: Secondary | ICD-10-CM | POA: Insufficient documentation

## 2019-09-12 DIAGNOSIS — Z23 Encounter for immunization: Secondary | ICD-10-CM | POA: Insufficient documentation

## 2019-09-12 MED ORDER — TETANUS-DIPHTH-ACELL PERTUSSIS 5-2.5-18.5 LF-MCG/0.5 IM SUSP
0.5000 mL | Freq: Once | INTRAMUSCULAR | Status: AC
  Administered 2019-09-12: 0.5 mL via INTRAMUSCULAR
  Filled 2019-09-12: qty 0.5

## 2019-09-12 NOTE — ED Notes (Signed)
Patient verbalizes understanding of discharge instructions. Opportunity for questioning and answers were provided. Armband removed by staff, pt discharged from ED. Pt. ambulatory and discharged home.  

## 2019-09-12 NOTE — ED Triage Notes (Signed)
Pt reports he was "plucking a hair" and now has what appears to be a small artrial bleed what is controlled w/ pressure.

## 2019-09-12 NOTE — ED Provider Notes (Signed)
[facility] EMERGENCY DEPARTMENT Provider Note   CSN: [id number] Arrival date & time: 09/12/19  1937     History Chief Complaint  Patient presents with  . Laceration    Mark Page is a 44 y.o. male.  The history is provided by the patient.  Laceration Location:  Head/neck Head/neck laceration location:  R neck Length:  1 cm Depth:  Through underlying tissue Quality: straight   Bleeding: arterial   Laceration mechanism:  Metal edge Pain details:    Quality:  Aching   Severity:  No pain Foreign body present:  No foreign bodies Relieved by:  Nothing Worsened by:  Nothing Ineffective treatments:  None tried Tetanus status:  Out of date Associated symptoms: no fever and no rash        Past Medical History:  Diagnosis Date  . Asthma   . Other bipolar disorders   . Tobacco use disorder     Patient Active Problem List   Diagnosis Date Noted  . Abscess of skin and subcutaneous tissue 02/23/2013  . TOBACCO USER 07/25/2008  . ASTHMA 10/26/2006  . DISORDER, BIPOLAR NEC 09/01/2006    History reviewed. No pertinent surgical history.     No family history on file.  Social History   Tobacco Use  . Smoking status: Former Smoker    Types: Cigarettes    Quit date: 02/12/2012    Years since quitting: 7.5  . Smokeless tobacco: Never Used  Substance Use Topics  . Alcohol use: Yes    Comment: socially  . Drug use: No    Home Medications Prior to Admission medications   Medication Sig Start Date End Date Taking? Authorizing Provider  multivitamin (ONE-A-DAY MEN'S) TABS tablet Take 1 tablet by mouth daily.   Yes [provider]    Allergies    Keflex [cephalexin]  Review of Systems   Review of Systems  Constitutional: Negative for chills and fever.  HENT: Negative for ear pain and sore throat.   Eyes: Negative for pain and visual disturbance.  Respiratory: Negative for cough and shortness of breath.   Cardiovascular: Negative  for chest pain and palpitations.  Gastrointestinal: Negative for abdominal pain and vomiting.  Genitourinary: Negative for dysuria and hematuria.  Musculoskeletal: Negative for arthralgias and back pain.  Skin: Positive for wound. Negative for color change and rash.  Neurological: Negative for seizures and syncope.  All other systems reviewed and are negative.   Physical Exam Updated Vital Signs BP 114/82 (BP Location: Left Arm)   Pulse 66   Temp 97.6 F (36.4 C) (Oral)   Resp 20   Ht 5\' 7"  (1.702 m)   Wt 77.1 kg   SpO2 94%   BMI 26.63 kg/m   Physical Exam Vitals and nursing note reviewed.  Constitutional:      General: He is not in acute distress.    Appearance: Normal appearance. He is well-developed and normal weight. He is not ill-appearing or toxic-appearing.  HENT:     Head: Normocephalic and atraumatic.     Right Ear: Tympanic membrane normal.     Left Ear: Tympanic membrane normal.     Nose: Nose normal.     Mouth/Throat:     Mouth: Mucous membranes are moist.  Eyes:     Conjunctiva/sclera: Conjunctivae normal.  Neck:   Cardiovascular:     Rate and Rhythm: Normal rate and regular rhythm.     Heart sounds: No murmur heard.   Pulmonary:  Effort: Pulmonary effort is normal. No respiratory distress.     Breath sounds: Normal breath sounds.  Abdominal:     Palpations: Abdomen is soft.     Tenderness: There is no abdominal tenderness.  Musculoskeletal:     Cervical back: Normal range of motion and neck supple.  Skin:    General: Skin is warm and dry.  Neurological:     General: No focal deficit present.     Mental Status: He is alert and oriented to person, place, and time. Mental status is at baseline.  Psychiatric:        Mood and Affect: Mood normal.        Behavior: Behavior normal.     ED Results / Procedures / Treatments   Labs (all labs ordered are listed, but only abnormal results are displayed) Labs Reviewed - No data to  display  EKG None  Radiology No results found.  Procedures .Marland KitchenLaceration Repair  Date/Time: 09/12/2019 7:59 PM Performed by: Nino Parsley, MD Authorized by: Benjiman Core, MD   Consent:    Consent obtained:  Emergent situation   Consent given by:  Patient   Risks discussed:  Vascular damage, tendon damage, retained foreign body, pain, poor cosmetic result, poor wound healing, nerve damage, need for additional repair and infection   Alternatives discussed:  No treatment Laceration details:    Location:  Neck   Neck location:  R anterior   Length (cm):  1 Repair type:    Repair type:  Simple Pre-procedure details:    Preparation:  Imaging obtained to evaluate for foreign bodies Exploration:    Hemostasis achieved with:  Tied off vessels   Wound exploration: wound explored through full range of motion   Treatment:    Amount of cleaning:  Standard   Irrigation method:  Syringe   Visualized foreign bodies/material removed: no   Skin repair:    Repair method:  Sutures   Suture size:  4-0   Wound skin closure material used: vicryl.   Suture technique:  Simple interrupted   Number of sutures:  2 Approximation:    Approximation:  Close Post-procedure details:    Dressing:  Antibiotic ointment   Patient tolerance of procedure:  Tolerated well, no immediate complications   (including critical care time)  Medications Ordered in ED Medications  Tdap (BOOSTRIX) injection 0.5 mL (has no administration in time range)    ED Course  Mark Page is a 44 y.o. male with PMHx listed that presents to the Emergency Department complaint of Laceration   ED Course: Initial exam completed.   Well-appearing and hemodynamically stable.  Nontoxic and afebrile.  Physical exam significant for 44 year old male with small 1 cm laceration to the right anterior neck superficial with small amount of arterial bleeding.  Laceration was repaired as noted above with immediate resolution and  hemostasis achieved with 1 suture over the area of concern; additional 1 suture was placed to achieve wound closure.  Patient was alert in the emergency department without evidence of rebleeding.  No swelling of the neck after repair.  Tdap updated. No indication for imaging as superficial wound with no evidence of deep tissue injury in the neck.   Diagnostics Vital Signs: reviewed Records: nursing notes along with previous records reviewed and pertinent data discussed   Consults:  none   Reevaluation/Disposition:  Upon reevaluation, patients symptoms stable/improved. No active nausea/vomiting and ambulatory without assistance prior to discharge from the emergency department.    All questions  answered.  Strict return precautions were discussed. Additionally we discussed establishing and/or following-up with primary care physician.  Patient and/or family was understanding and in agreement with today's assessment and plan.   Campbell Riches, MD Emergency Medicine, PGY-3   Note: Dragon medical dictation software was used in the creation of this note.   Final Clinical Impression(s) / ED Diagnoses Final diagnoses:  Facial laceration, initial encounter    Rx / DC Orders ED Discharge Orders    None       Nino Parsley, MD 09/12/19 2126    Benjiman Core, MD 09/12/19 2220

## 2019-09-12 NOTE — Discharge Instructions (Signed)
Please keep area clean and dry.  Sutures will dissolve on their own. Return to ED for any bleeding or worsening/concerning symptoms.
